# Patient Record
Sex: Female | Born: 1940 | Marital: Single | Smoking: Former smoker
Health system: Southern US, Community
[De-identification: ages and names within clinical notes are randomized; demographics above are authoritative.]

## PROBLEM LIST (undated history)

## (undated) DIAGNOSIS — I10 Essential (primary) hypertension: Secondary | ICD-10-CM

## (undated) DIAGNOSIS — K219 Gastro-esophageal reflux disease without esophagitis: Secondary | ICD-10-CM

## (undated) DIAGNOSIS — H3530 Unspecified macular degeneration: Secondary | ICD-10-CM

---

## 2009-05-14 DEATH — deceased

## 2013-07-28 ENCOUNTER — Inpatient Hospital Stay: Payer: Self-pay | Admitting: Internal Medicine

## 2013-07-28 LAB — URINALYSIS, COMPLETE
Bacteria: NONE SEEN
Bilirubin,UR: NEGATIVE
Glucose,UR: 50 mg/dL (ref 0–75)
Ph: 6 (ref 4.5–8.0)
Protein: NEGATIVE
RBC,UR: <1 /HPF (ref 0–5)
Specific Gravity: 1.011 (ref 1.003–1.030)
WBC UR: 2 /HPF (ref 0–5)

## 2013-07-28 LAB — COMPREHENSIVE METABOLIC PANEL
Albumin: 3.6 g/dL (ref 3.4–5.0)
Alkaline Phosphatase: 82 U/L
Anion Gap: 7 (ref 7–16)
BUN: 26 mg/dL — ABNORMAL HIGH (ref 7–18)
Calcium, Total: 9.7 mg/dL (ref 8.5–10.1)
Co2: 23 mmol/L (ref 21–32)
Creatinine: 1.14 mg/dL (ref 0.60–1.30)
EGFR (African American): 56 — ABNORMAL LOW
EGFR (Non-African Amer.): 49 — ABNORMAL LOW
Glucose: 108 mg/dL — ABNORMAL HIGH (ref 65–99)
Osmolality: 274 (ref 275–301)
SGOT(AST): 19 U/L (ref 15–37)
SGPT (ALT): 20 U/L (ref 12–78)
Total Protein: 7 g/dL (ref 6.4–8.2)

## 2013-07-28 LAB — CBC
HCT: 35.2 % (ref 35.0–47.0)
HGB: 12 g/dL (ref 12.0–16.0)
Platelet: 214 10*3/uL (ref 150–440)
RBC: 4.2 10*6/uL (ref 3.80–5.20)
RDW: 13.8 % (ref 11.5–14.5)
WBC: 6.4 10*3/uL (ref 3.6–11.0)

## 2013-07-28 LAB — TROPONIN I: Troponin-I: 0.1 ng/mL — ABNORMAL HIGH

## 2013-07-29 DIAGNOSIS — I059 Rheumatic mitral valve disease, unspecified: Secondary | ICD-10-CM

## 2013-07-29 LAB — CK: CK, Total: 65 U/L (ref 21–215)

## 2013-07-29 LAB — COMPREHENSIVE METABOLIC PANEL
Alkaline Phosphatase: 71 U/L
BUN: 27 mg/dL — ABNORMAL HIGH (ref 7–18)
Calcium, Total: 8.8 mg/dL (ref 8.5–10.1)
Chloride: 104 mmol/L (ref 98–107)
Co2: 26 mmol/L (ref 21–32)
Creatinine: 1.62 mg/dL — ABNORMAL HIGH (ref 0.60–1.30)
EGFR (African American): 37 — ABNORMAL LOW
EGFR (Non-African Amer.): 32 — ABNORMAL LOW
Glucose: 111 mg/dL — ABNORMAL HIGH (ref 65–99)
Osmolality: 276 (ref 275–301)
SGOT(AST): 20 U/L (ref 15–37)
Sodium: 135 mmol/L — ABNORMAL LOW (ref 136–145)
Total Protein: 6.3 g/dL — ABNORMAL LOW (ref 6.4–8.2)

## 2013-07-29 LAB — TROPONIN I: Troponin-I: 0.11 ng/mL — ABNORMAL HIGH

## 2013-07-29 LAB — CBC WITH DIFFERENTIAL/PLATELET
Basophil #: 0.1 10*3/uL (ref 0.0–0.1)
Basophil %: 0.9 %
Eosinophil #: 0.1 10*3/uL (ref 0.0–0.7)
Eosinophil %: 1.4 %
HCT: 32.2 % — ABNORMAL LOW (ref 35.0–47.0)
HGB: 11.2 g/dL — ABNORMAL LOW (ref 12.0–16.0)
Lymphocyte #: 1.8 10*3/uL (ref 1.0–3.6)
MCV: 84 fL (ref 80–100)
Monocyte %: 9.7 %
Neutrophil #: 3.2 10*3/uL (ref 1.4–6.5)
Neutrophil %: 56 %
RBC: 3.83 10*6/uL (ref 3.80–5.20)
RDW: 13.6 % (ref 11.5–14.5)

## 2013-07-29 LAB — LIPID PANEL
Cholesterol: 173 mg/dL (ref 0–200)
HDL Cholesterol: 56 mg/dL (ref 40–60)
Ldl Cholesterol, Calc: 92 mg/dL (ref 0–100)
Triglycerides: 125 mg/dL (ref 0–200)
VLDL Cholesterol, Calc: 25 mg/dL (ref 5–40)

## 2013-07-29 LAB — MAGNESIUM: Magnesium: 1.6 mg/dL — ABNORMAL LOW

## 2013-07-30 LAB — CREATININE, SERUM
Creatinine: 1.35 mg/dL — ABNORMAL HIGH (ref 0.60–1.30)
EGFR (African American): 46 — ABNORMAL LOW
EGFR (Non-African Amer.): 40 — ABNORMAL LOW

## 2013-07-30 LAB — TSH: Thyroid Stimulating Horm: 1.67 u[IU]/mL

## 2013-10-11 ENCOUNTER — Inpatient Hospital Stay: Admit: 2013-10-11 | Payer: Self-pay | Admitting: Neurology

## 2013-10-11 ENCOUNTER — Emergency Department: Payer: Self-pay | Admitting: Emergency Medicine

## 2013-10-11 ENCOUNTER — Encounter (HOSPITAL_COMMUNITY): Payer: Self-pay | Admitting: Emergency Medicine

## 2013-10-11 ENCOUNTER — Inpatient Hospital Stay (HOSPITAL_COMMUNITY)
Admission: EM | Admit: 2013-10-11 | Discharge: 2013-10-22 | DRG: 064 | Disposition: A | Discharge status: Hospice | Payer: Medicare Other | Attending: Internal Medicine | Admitting: Internal Medicine

## 2013-10-11 ENCOUNTER — Inpatient Hospital Stay (HOSPITAL_COMMUNITY): Payer: Medicare Other

## 2013-10-11 ENCOUNTER — Emergency Department (HOSPITAL_COMMUNITY): Payer: Medicare Other

## 2013-10-11 DIAGNOSIS — H3530 Unspecified macular degeneration: Secondary | ICD-10-CM | POA: Diagnosis present

## 2013-10-11 DIAGNOSIS — Z87891 Personal history of nicotine dependence: Secondary | ICD-10-CM

## 2013-10-11 DIAGNOSIS — J9600 Acute respiratory failure, unspecified whether with hypoxia or hypercapnia: Secondary | ICD-10-CM | POA: Diagnosis not present

## 2013-10-11 DIAGNOSIS — Z515 Encounter for palliative care: Secondary | ICD-10-CM

## 2013-10-11 DIAGNOSIS — D649 Anemia, unspecified: Secondary | ICD-10-CM

## 2013-10-11 DIAGNOSIS — R7309 Other abnormal glucose: Secondary | ICD-10-CM | POA: Diagnosis present

## 2013-10-11 DIAGNOSIS — I6340 Cerebral infarction due to embolism of unspecified cerebral artery: Principal | ICD-10-CM | POA: Diagnosis present

## 2013-10-11 DIAGNOSIS — I5031 Acute diastolic (congestive) heart failure: Secondary | ICD-10-CM

## 2013-10-11 DIAGNOSIS — I214 Non-ST elevation (NSTEMI) myocardial infarction: Secondary | ICD-10-CM

## 2013-10-11 DIAGNOSIS — R633 Feeding difficulties: Secondary | ICD-10-CM

## 2013-10-11 DIAGNOSIS — G8190 Hemiplegia, unspecified affecting unspecified side: Secondary | ICD-10-CM | POA: Diagnosis present

## 2013-10-11 DIAGNOSIS — R1310 Dysphagia, unspecified: Secondary | ICD-10-CM

## 2013-10-11 DIAGNOSIS — I6350 Cerebral infarction due to unspecified occlusion or stenosis of unspecified cerebral artery: Secondary | ICD-10-CM

## 2013-10-11 DIAGNOSIS — Z8249 Family history of ischemic heart disease and other diseases of the circulatory system: Secondary | ICD-10-CM

## 2013-10-11 DIAGNOSIS — Z66 Do not resuscitate: Secondary | ICD-10-CM | POA: Diagnosis present

## 2013-10-11 DIAGNOSIS — E870 Hyperosmolality and hypernatremia: Secondary | ICD-10-CM

## 2013-10-11 DIAGNOSIS — I69391 Dysphagia following cerebral infarction: Secondary | ICD-10-CM

## 2013-10-11 DIAGNOSIS — N179 Acute kidney failure, unspecified: Secondary | ICD-10-CM | POA: Diagnosis present

## 2013-10-11 DIAGNOSIS — I658 Occlusion and stenosis of other precerebral arteries: Secondary | ICD-10-CM | POA: Diagnosis present

## 2013-10-11 DIAGNOSIS — I6529 Occlusion and stenosis of unspecified carotid artery: Secondary | ICD-10-CM | POA: Diagnosis present

## 2013-10-11 DIAGNOSIS — R1312 Dysphagia, oropharyngeal phase: Secondary | ICD-10-CM | POA: Diagnosis present

## 2013-10-11 DIAGNOSIS — I4891 Unspecified atrial fibrillation: Secondary | ICD-10-CM

## 2013-10-11 DIAGNOSIS — E785 Hyperlipidemia, unspecified: Secondary | ICD-10-CM | POA: Diagnosis present

## 2013-10-11 DIAGNOSIS — K219 Gastro-esophageal reflux disease without esophagitis: Secondary | ICD-10-CM | POA: Diagnosis present

## 2013-10-11 DIAGNOSIS — I509 Heart failure, unspecified: Secondary | ICD-10-CM | POA: Diagnosis not present

## 2013-10-11 DIAGNOSIS — I639 Cerebral infarction, unspecified: Secondary | ICD-10-CM | POA: Insufficient documentation

## 2013-10-11 DIAGNOSIS — R414 Neurologic neglect syndrome: Secondary | ICD-10-CM | POA: Diagnosis present

## 2013-10-11 DIAGNOSIS — N289 Disorder of kidney and ureter, unspecified: Secondary | ICD-10-CM | POA: Diagnosis not present

## 2013-10-11 DIAGNOSIS — R29810 Facial weakness: Secondary | ICD-10-CM | POA: Diagnosis present

## 2013-10-11 DIAGNOSIS — R6339 Other feeding difficulties: Secondary | ICD-10-CM

## 2013-10-11 DIAGNOSIS — I10 Essential (primary) hypertension: Secondary | ICD-10-CM | POA: Diagnosis present

## 2013-10-11 DIAGNOSIS — E876 Hypokalemia: Secondary | ICD-10-CM | POA: Diagnosis present

## 2013-10-11 DIAGNOSIS — R0600 Dyspnea, unspecified: Secondary | ICD-10-CM

## 2013-10-11 DIAGNOSIS — Z79899 Other long term (current) drug therapy: Secondary | ICD-10-CM

## 2013-10-11 HISTORY — DX: Gastro-esophageal reflux disease without esophagitis: K21.9

## 2013-10-11 HISTORY — DX: Unspecified macular degeneration: H35.30

## 2013-10-11 HISTORY — DX: Essential (primary) hypertension: I10

## 2013-10-11 LAB — COMPREHENSIVE METABOLIC PANEL
ALBUMIN: 3.6 g/dL (ref 3.4–5.0)
ALBUMIN: 3.2 g/dL — AB (ref 3.5–5.2)
ALT: 20 U/L (ref 0–35)
AST: 91 U/L — AB (ref 15–37)
AST: 53 U/L — AB (ref 0–37)
Alkaline Phosphatase: 77 U/L
Alkaline Phosphatase: 68 U/L (ref 39–117)
Anion Gap: 8 (ref 7–16)
BUN: 26 mg/dL — AB (ref 7–18)
BUN: 28 mg/dL — ABNORMAL HIGH (ref 6–23)
Bilirubin,Total: 0.7 mg/dL (ref 0.2–1.0)
CALCIUM: 9.4 mg/dL (ref 8.4–10.5)
CHLORIDE: 103 mmol/L (ref 98–107)
CO2: 25 mEq/L (ref 19–32)
CREATININE: 1.12 mg/dL (ref 0.60–1.30)
Calcium, Total: 9.5 mg/dL (ref 8.5–10.1)
Chloride: 98 mEq/L (ref 96–112)
Co2: 31 mmol/L (ref 21–32)
Creatinine, Ser: 1.19 mg/dL — ABNORMAL HIGH (ref 0.50–1.10)
EGFR (African American): 58 — ABNORMAL LOW
GFR CALC NON AF AMER: 50 — AB
GFR calc Af Amer: 52 mL/min — ABNORMAL LOW (ref >90)
GFR calc non Af Amer: 45 mL/min — ABNORMAL LOW (ref >90)
Glucose, Bld: 155 mg/dL — ABNORMAL HIGH (ref 70–99)
Glucose: 114 mg/dL — ABNORMAL HIGH (ref 65–99)
OSMOLALITY: 289 (ref 275–301)
Potassium: 3.5 mmol/L (ref 3.5–5.1)
Potassium: 3.2 mEq/L — ABNORMAL LOW (ref 3.7–5.3)
SGPT (ALT): 30 U/L (ref 12–78)
Sodium: 142 mmol/L (ref 136–145)
Sodium: 148 mEq/L — ABNORMAL HIGH (ref 137–147)
Total Bilirubin: 0.4 mg/dL (ref 0.3–1.2)
Total Protein: 7 g/dL (ref 6.4–8.2)
Total Protein: 6.1 g/dL (ref 6.0–8.3)

## 2013-10-11 LAB — URINALYSIS, COMPLETE
Bacteria: NONE SEEN
Bilirubin,UR: NEGATIVE
Blood: NEGATIVE
Glucose,UR: 150 mg/dL (ref 0–75)
Hyaline Cast: 7
Leukocyte Esterase: NEGATIVE
NITRITE: NEGATIVE
Ph: 6 (ref 4.5–8.0)
Protein: 30
RBC,UR: 3 /HPF (ref 0–5)
Specific Gravity: 1.016 (ref 1.003–1.030)
Squamous Epithelial: NONE SEEN
WBC UR: 1 /HPF (ref 0–5)

## 2013-10-11 LAB — CBC WITH DIFFERENTIAL/PLATELET
BASOS ABS: 0 10*3/uL (ref 0.0–0.1)
Basophils Relative: 0 % (ref 0–1)
Eosinophils Absolute: 0 10*3/uL (ref 0.0–0.7)
Eosinophils Relative: 0 % (ref 0–5)
HCT: 28.1 % — ABNORMAL LOW (ref 36.0–46.0)
Hemoglobin: 9.6 g/dL — ABNORMAL LOW (ref 12.0–15.0)
Lymphocytes Relative: 11 % — ABNORMAL LOW (ref 12–46)
Lymphs Abs: 1 10*3/uL (ref 0.7–4.0)
MCH: 29.8 pg (ref 26.0–34.0)
MCHC: 34.2 g/dL (ref 30.0–36.0)
MCV: 87.3 fL (ref 78.0–100.0)
Monocytes Absolute: 0.8 10*3/uL (ref 0.1–1.0)
Monocytes Relative: 9 % (ref 3–12)
NEUTROS ABS: 7.1 10*3/uL (ref 1.7–7.7)
Neutrophils Relative %: 80 % — ABNORMAL HIGH (ref 43–77)
Platelets: 198 10*3/uL (ref 150–400)
RBC: 3.22 MIL/uL — ABNORMAL LOW (ref 3.87–5.11)
RDW: 15.4 % (ref 11.5–15.5)
WBC: 8.9 10*3/uL (ref 4.0–10.5)

## 2013-10-11 LAB — PROTIME-INR
INR: 1.09 (ref 0.00–1.49)
Prothrombin Time: 13.9 seconds (ref 11.6–15.2)

## 2013-10-11 LAB — TROPONIN I
TROPONIN-I: 6.9 ng/mL — AB
Troponin I: 4.15 ng/mL (ref <0.30)
Troponin I: 4.85 ng/mL (ref <0.30)

## 2013-10-11 LAB — CBC
HCT: 31.4 % — ABNORMAL LOW (ref 35.0–47.0)
HGB: 10.2 g/dL — ABNORMAL LOW (ref 12.0–16.0)
MCH: 28.4 pg (ref 26.0–34.0)
MCHC: 32.5 g/dL (ref 32.0–36.0)
MCV: 87 fL (ref 80–100)
Platelet: 217 10*3/uL (ref 150–440)
RBC: 3.59 10*6/uL — ABNORMAL LOW (ref 3.80–5.20)
RDW: 15.8 % — AB (ref 11.5–14.5)
WBC: 9.9 10*3/uL (ref 3.6–11.0)

## 2013-10-11 LAB — CK TOTAL AND CKMB (NOT AT ARMC)
CK, MB: 18.8 ng/mL (ref 0.3–4.0)
CK, Total: 868 U/L — ABNORMAL HIGH
CK-MB: 26.1 ng/mL — AB (ref 0.5–3.6)
RELATIVE INDEX: 2.6 — AB (ref 0.0–2.5)
Total CK: 710 U/L — ABNORMAL HIGH (ref 7–177)

## 2013-10-11 LAB — APTT
Activated PTT: 32.1 secs (ref 23.6–35.9)
aPTT: 35 seconds (ref 24–37)

## 2013-10-11 LAB — GLUCOSE, CAPILLARY: Glucose-Capillary: 138 mg/dL — ABNORMAL HIGH (ref 70–99)

## 2013-10-11 LAB — CK-MB: CK-MB: 25.6 ng/mL — ABNORMAL HIGH (ref 0.5–3.6)

## 2013-10-11 LAB — MRSA PCR SCREENING: MRSA by PCR: NEGATIVE

## 2013-10-11 MED ORDER — KCL IN DEXTROSE-NACL 40-5-0.9 MEQ/L-%-% IV SOLN
INTRAVENOUS | Status: DC
  Administered 2013-10-11 – 2013-10-13 (×3): via INTRAVENOUS
  Filled 2013-10-11 (×5): qty 1000

## 2013-10-11 MED ORDER — AMIODARONE HCL IN DEXTROSE 360-4.14 MG/200ML-% IV SOLN
30.0000 mg/h | INTRAVENOUS | Status: DC
Start: 2013-10-12 — End: 2013-10-12
  Administered 2013-10-12: 30 mg/h via INTRAVENOUS
  Filled 2013-10-11 (×2): qty 200

## 2013-10-11 MED ORDER — SENNOSIDES-DOCUSATE SODIUM 8.6-50 MG PO TABS
1.0000 | ORAL_TABLET | Freq: Every evening | ORAL | Status: DC | PRN
  Filled 2013-10-11: qty 1

## 2013-10-11 MED ORDER — FAMOTIDINE IN NACL 20-0.9 MG/50ML-% IV SOLN
20.0000 mg | Freq: Two times a day (BID) | INTRAVENOUS | Status: DC
  Administered 2013-10-11 – 2013-10-12 (×2): 20 mg via INTRAVENOUS
  Filled 2013-10-11 (×3): qty 50

## 2013-10-11 MED ORDER — HEPARIN SODIUM (PORCINE) 5000 UNIT/ML IJ SOLN
5000.0000 [IU] | Freq: Three times a day (TID) | INTRAMUSCULAR | Status: DC
  Administered 2013-10-11 – 2013-10-20 (×27): 5000 [IU] via SUBCUTANEOUS
  Filled 2013-10-11 (×30): qty 1

## 2013-10-11 MED ORDER — AMIODARONE HCL IN DEXTROSE 360-4.14 MG/200ML-% IV SOLN
60.0000 mg/h | INTRAVENOUS | Status: AC
  Administered 2013-10-11: 60 mg/h via INTRAVENOUS
  Filled 2013-10-11: qty 200

## 2013-10-11 MED ORDER — INSULIN ASPART 100 UNIT/ML ~~LOC~~ SOLN
1.0000 [IU] | SUBCUTANEOUS | Status: DC
Start: 2013-10-12 — End: 2013-10-13
  Administered 2013-10-12 (×2): 1 [IU] via SUBCUTANEOUS

## 2013-10-11 MED ORDER — ASPIRIN 300 MG RE SUPP
300.0000 mg | Freq: Every day | RECTAL | Status: DC
  Administered 2013-10-12 – 2013-10-17 (×7): 300 mg via RECTAL
  Filled 2013-10-11 (×9): qty 1

## 2013-10-11 MED ORDER — IOHEXOL 350 MG/ML SOLN
50.0000 mL | Freq: Once | INTRAVENOUS | Status: AC | PRN
  Administered 2013-10-11: 50 mL via INTRAVENOUS

## 2013-10-11 MED ORDER — POTASSIUM CHLORIDE 10 MEQ/100ML IV SOLN
10.0000 meq | INTRAVENOUS | Status: AC
  Administered 2013-10-11 (×2): 10 meq via INTRAVENOUS
  Filled 2013-10-11 (×3): qty 100

## 2013-10-11 MED ORDER — AMIODARONE HCL IN DEXTROSE 360-4.14 MG/200ML-% IV SOLN
60.0000 mg/h | Freq: Once | INTRAVENOUS | Status: AC
  Administered 2013-10-11: 60 mg/h via INTRAVENOUS

## 2013-10-11 NOTE — ED Notes (Signed)
Neuro assessment continued at the bedside at arrival from CT.

## 2013-10-11 NOTE — ED Notes (Signed)
Patient returned to the room from CT.

## 2013-10-11 NOTE — ED Notes (Signed)
After reading the medica records it was noted that previous staff stated the pt had some trouble forming words at 1331.  When asked the family at bedside stated that this is not the pt's baseline, and was slightly abnormal for her.  Per neurology pt's last seen normal is now considered to be last night and not this afternoon.

## 2013-10-11 NOTE — Consult Note (Signed)
Referring Physician: Lynelle Doctor    Chief Complaint: Right sided weakness  HPI: Wendy Malone is an 73 y.o. female who went to bed normal last evening.  Was found on the floor by her daughter this morning.  Patient is unclear about the details of what happened.  Patient was initially taken to [facility] where the patient reported that she thought she got dizzy and fell the day before.  Was initially evaluated as being able to move all extremities but speech was altered and patient was having trouble getting her words out.  While being further evaluated in the ED the patient was noted to have right sided weakness.  SOC was contacted and it was felt that the patient would benefit from the additional services available to her at a Stroke Center.  Patient was transferred to [facility] at that time.  Initial NIHSS on presentation to [facility] of 18.   While being evaluated at [facility] patient also noted to have an elevated troponin.  She was given a bolus of Heparin.  Also noted to be in atrial fibrillation.  Date last known well: Date: 10/10/2013 Time last known well: Time: 20:00 tPA Given: No: Patient outside time window  Past Medical History  Diagnosis Date  . Hypertension     since December 2014    History reviewed. No pertinent past surgical history.  Family history: Daughter alive and well.    Social History:  reports that she quit smoking about 2 months ago. She does not have any smokeless tobacco history on file. She reports that she does not drink alcohol or use illicit drugs.  Allergies: No Known Allergies  Medications: I have reviewed the patient's current medications. Prior to Admission:  Current outpatient prescriptions: aspirin 81 MG tablet, Take 81 mg by mouth daily., Disp: , Rfl: ;   metoprolol tartrate (LOPRESSOR) 25 MG tablet, Take 25 mg by mouth 2 (two) times daily., Disp: , Rfl: ;   ranitidine (ZANTAC) 150 MG tablet, Take 150 mg by mouth daily., Disp: , Rfl:   ROS: History obtained  from the patient  General ROS: negative for - chills, fatigue, fever, night sweats, weight gain or weight loss Psychological ROS: negative for - behavioral disorder, hallucinations, memory difficulties, mood swings or suicidal ideation Ophthalmic ROS: negative for - blurry vision, double vision, eye pain or loss of vision ENT ROS: negative for - epistaxis, nasal discharge, oral lesions, sore throat, tinnitus or vertigo Allergy and Immunology ROS: negative for - hives or itchy/watery eyes Hematological and Lymphatic ROS: negative for - bleeding problems, bruising or swollen lymph nodes Endocrine ROS: negative for - galactorrhea, hair pattern changes, polydipsia/polyuria or temperature intolerance Respiratory ROS: negative for - cough, hemoptysis, shortness of breath or wheezing Cardiovascular ROS: negative for - chest pain, dyspnea on exertion, edema or irregular heartbeat Gastrointestinal ROS: negative for - abdominal pain, diarrhea, hematemesis, nausea/vomiting or stool incontinence Genito-Urinary ROS: negative for - dysuria, hematuria, incontinence or urinary frequency/urgency Musculoskeletal ROS: negative for - joint swelling or muscular weakness Neurological ROS: as noted in HPI Dermatological ROS: erythematous, escoriated right knee  Physical Examination: Blood pressure 182/60, pulse 76, temperature 98.4 F (36.9 C), temperature source Oral, resp. rate 14, SpO2 100.00%.  Neurologic Examination: Mental Status: Alert, oriented, thought content appropriate.  Speech fluent without evidence of aphasia but slurred.  Able to follow simple commands without difficulty. Cranial Nerves: II: Discs flat bilaterally; Visual fields grossly normal, pupils equal, round, reactive to light and accommodation III,IV, VI: ptosis not present, patient  unable to go beyond midline to the right V,VII: right facial droop, facial light touch sensation decreased on the right VIII: hearing normal  bilaterally IX,X: gag reflex reduced XI: shoulder shrug decreased on the right XII: midline tongue extension Motor: Right : Upper extremity   1/5    Left:     Upper extremity   5/5  Lower extremity   1/5     Lower extremity   5/5 Tone and bulk:normal tone throughout; no atrophy noted Sensory: Pinprick and light touch decreased on the right Deep Tendon Reflexes: 2+ throughout with an absent left ankle jerk Plantars: Right: upgoing   Left: mute Cerebellar: normal finger-to-nose and normal heel-to-shin testing on the left.  Unable to perform on the right Gait: Unable to test due to weakness CV: pulses palpable throughout     Laboratory Studies: (Lab results from [place] reviewed) Basic Metabolic Panel:  Recent Labs Lab 10/11/13 1857  NA 148*  K 3.2*  CL 98  CO2 25  GLUCOSE 155*  BUN 28*  CREATININE 1.19*  CALCIUM 9.4    Liver Function Tests:  Recent Labs Lab 10/11/13 1857  AST 53*  ALT 20  ALKPHOS 68  BILITOT 0.4  PROT 6.1  ALBUMIN 3.2*   No results found for this basename: LIPASE, AMYLASE,  in the last 168 hours No results found for this basename: AMMONIA,  in the last 168 hours  CBC:  Recent Labs Lab 10/11/13 1857  WBC 8.9  NEUTROABS 7.1  HGB 9.6*  HCT 28.1*  MCV 87.3  PLT 198    Cardiac Enzymes:  Recent Labs Lab 10/11/13 1857  CKTOTAL 710*  CKMB 18.8*  TROPONINI 4.15*    BNP: No components found with this basename: POCBNP,   CBG: No results found for this basename: GLUCAP,  in the last 168 hours  Microbiology: No results found for this or any previous visit.  Coagulation Studies:  Recent Labs  10/11/13 1857  LABPROT 13.9  INR 1.09    Urinalysis: No results found for this basename: COLORURINE, APPERANCEUR, LABSPEC, PHURINE, GLUCOSEU, HGBUR, BILIRUBINUR, KETONESUR, PROTEINUR, UROBILINOGEN, NITRITE, LEUKOCYTESUR,  in the last 168 hours  Lipid Panel: No results found for this basename: chol,  trig,  hdl,  cholhdl,  vldl,   ldlcalc    HgbA1C:  No results found for this basename: HGBA1C    Urine Drug Screen:   No results found for this basename: labopia,  cocainscrnur,  labbenz,  amphetmu,  thcu,  labbarb    Alcohol Level: No results found for this basename: ETH,  in the last 168 hours  Other results: EKG: atrial fibrillation at 82 bpm.  Imaging: CT head: No acute changes.  Extensive small vessel ischemic changes.    Assessment: 73 y.o. female presenting with slurred speech, left eye preference and right hemiparesis.  Left brain stroke suspected.  Patient with elevated troponins as well.  Head CT reviewed and shows no acute changes.  Patient outside treatment window for both intervention and tPA.  Further work up recommended.  Will likely benefit from the input of other services as well.    Stroke Risk Factors - atrial fibrillation and hypertension  Plan: 1. HgbA1c, fasting lipid panel 2. MRI of the brain without contrast 3. PT consult, OT consult, Speech consult 4. Echocardiogram 5. Carotid dopplers 6. Prophylactic therapy-Antiplatelet med: Aspirin - dose 300mg  rectally.  Would refrain from anticoagulation if at all possible, due to suspected size of stroke and risk of hemorrhage.   7.  Risk factor modification 8. Telemetry monitoring 9. Frequent neuro checks 10.  CCM consulted about admission.  Patient may also require cardiology and wound care input as well.   11.  CTA of the brain  This patient is critically ill and at significant risk of neurological worsening, death and care requires constant monitoring of vital signs, hemodynamics,respiratory and cardiac monitoring, neurological assessment, discussion with family, other specialists and medical decision making of high complexity. I spent 90 minutes of neurocritical care time in the care of  this patient.  Thana FarrLeslie Payeton Germani, MD [facility] [phone] 10/11/2013  8:24 PM

## 2013-10-11 NOTE — ED Notes (Signed)
MD at bedside. (Dr. Thad Rangereynolds at the bedside).

## 2013-10-11 NOTE — ED Notes (Signed)
Patient has macular degenerative disease.

## 2013-10-11 NOTE — H&P (Signed)
Name: Wendy Malone MRN: [id number] DOB: 12-03-1940    ADMISSION DATE:  10/11/2013  PRIMARY SERVICE: PCCM  CHIEF COMPLAINT:  Ischemic stroke  BRIEF PATIENT DESCRIPTION:  73 years old female with PMH relevant for HTN and GERD. Presents with right hemiparesis and facial droop. Last seen normal the day prior. Out of window for TPA. Awake, alert, oriented and protecting her airway. Hemodynamically stable. Intermittent rapid A. Fib on amiodarone drip. DNR/DNI.  SIGNIFICANT EVENTS / STUDIES:  IMPRESSION:  1. No acute or subacute cortical infarct is evident.  2. Bilateral basal ganglia infarcts are age indeterminate.  3. 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  4. Segmental stenoses within the PCA branch vessels bilaterally and  right greater than left MCA branch vessels.  5. Segmental stenoses within the pericallosal arteries, right  greater than left.   LINES / TUBES: - Peripheral IV's  CULTURES:   ANTIBIOTICS:   HISTORY OF PRESENT ILLNESS:   73 years old female with PMH relevant for HTN and GERD. Was found on the floor this morning by her daughter. The patient was last seen doing well the night prior and as per the patient she fell that night. She was transferred to [facility] where she was found to be on A. Fib and with right hemiparesis, slurred speech and right facial droop. She was transferred to [facility] for management. Seen by Neurology (Dr Thad Ranger) in the ED. CT of the head with no evidence of hemorrhage on no acute or subacute infarct seen. Patient out of window for TPA. Started on amiodarone drip for intermittent A. Fib with RVR. EKG shows sinus rhythm with <109mm ST depression from V3 to V5. Troponin of 4 here at [facility], apparently at [facility] was 6. No chest pain. At the time of my exam the patient is awake, aler, oriented x 3 and hemodynamically stable. On sinus rhythm. Protecting her airway.   PAST MEDICAL HISTORY :  Past Medical History    Diagnosis Date  . Hypertension     since December 2014   History reviewed. No pertinent past surgical history. Prior to Admission medications   Medication Sig Start Date End Date Taking? Authorizing Provider  aspirin 81 MG tablet Take 81 mg by mouth daily.   Yes Historical Provider, MD  metoprolol tartrate (LOPRESSOR) 25 MG tablet Take 25 mg by mouth 2 (two) times daily. 09/23/13  Yes Historical Provider, MD  ranitidine (ZANTAC) 150 MG tablet Take 150 mg by mouth daily.   Yes Historical Provider, MD   No Known Allergies  FAMILY HISTORY:  History reviewed. No pertinent family history. SOCIAL HISTORY:  reports that she quit smoking about 2 months ago. She does not have any smokeless tobacco history on file. She reports that she does not drink alcohol or use illicit drugs.  REVIEW OF SYSTEMS:  All systems reviewed and found negative except for what I mentioned in the HPI.   SUBJECTIVE:   VITAL SIGNS: Temp:  [98.4 F (36.9 C)] 98.4 F (36.9 C) (02/28 2015) Pulse Rate:  [74-85] 76 (02/28 2010) Resp:  [13-14] 13 (02/28 2100) BP: (166-188)/(49-76) 170/76 mmHg (02/28 2100) SpO2:  [100 %] 100 % (02/28 2010) HEMODYNAMICS:   VENTILATOR SETTINGS:   INTAKE / OUTPUT: Intake/Output   None     PHYSICAL EXAMINATION: General: Pleasant female patient in no acute distress. Eyes: Anicteric sclerae. ENT: Oropharynx clear. Dry mucous membranes. No thrush Lymph: No cervical, supraclavicular, or axillary lymphadenopathy. Heart: Normal S1, S2.  No murmurs, rubs, or gallops appreciated. No bruits, equal pulses. Lungs: Normal excursion, no dullness to percussion. Good air movement bilaterally, without wheezes or crackles. Normal upper airway sounds without evidence of stridor. Abdomen: Abdomen soft, non-tender and not distended, normoactive bowel sounds. No hepatosplenomegaly or masses. Musculoskeletal: No clubbing or synovitis. Skin: No rashes or lesions Neuro: Right hemiparesis 1/5 on both left  upper and lower extremity. Upgoing Babinski on the right and downgoing on the left. Right facial droop.   LABS:  CBC  Recent Labs Lab 10/11/13 1857  WBC 8.9  HGB 9.6*  HCT 28.1*  PLT 198   Coag's  Recent Labs Lab 10/11/13 1857  APTT 35  INR 1.09   BMET  Recent Labs Lab 10/11/13 1857  NA 148*  K 3.2*  CL 98  CO2 25  BUN 28*  CREATININE 1.19*  GLUCOSE 155*   Electrolytes  Recent Labs Lab 10/11/13 1857  CALCIUM 9.4   Sepsis Markers No results found for this basename: LATICACIDVEN, PROCALCITON, O2SATVEN,  in the last 168 hours ABG No results found for this basename: PHART, PCO2ART, PO2ART,  in the last 168 hours Liver Enzymes  Recent Labs Lab 10/11/13 1857  AST 53*  ALT 20  ALKPHOS 68  BILITOT 0.4  ALBUMIN 3.2*   Cardiac Enzymes  Recent Labs Lab 10/11/13 1857  TROPONINI 4.15*   Glucose No results found for this basename: GLUCAP,  in the last 168 hours  Imaging Dg Chest 2 View  10/11/2013   CLINICAL DATA:  Stroke.  EXAM: CHEST  2 VIEW  COMPARISON:  None.  FINDINGS: Cardiac silhouette is mildly enlarged. Normal mediastinal and hilar contours.  There are prominent bronchovascular markings. Lungs are otherwise clear. No pleural effusion or pneumothorax.  The bony thorax is intact.  IMPRESSION: No acute cardiopulmonary disease.   Electronically Signed   By: Amie Portland M.D.   On: 10/11/2013 21:03     CXR:  Normal chest X ray  ASSESSMENT / PLAN:  PULMONARY A: 1) No issues. Protecting her airway. P:    CARDIOVASCULAR A:  1) Intermittent rapid A. Fib 2) Elevated troponin with non specific EKG changes. No chest pain P:  - Continue amiodarone drip - Will trend troponin - As per neurology, we will try to avoid anticoagulation given the size of the stroke and risk for bleeding. If troponin trending up and absolutely necessary from the Cardiology standpoint we would do careful Heparin drip  with no bolus.  RENAL A:   1) Mild acute renal  failure, mildly elevated CK 2) Hypokalemia 3) Hypernatremia P:   - IVF with D5//NS with 40 meq of KCL at 100cc/hr - Will give also 2 rounds of IV KCL 10 meq - Will follow BMP in am  GASTROINTESTINAL A:   1) GERD P:   - NPO - Pepcid 20 mg IV q12h  HEMATOLOGIC A:   1) Anemia, unclear etiology. Will need workup P:  - Will follow CBC  INFECTIOUS A:   1) No issues P:    ENDOCRINE A:   1) No history of DM, Hyperglycemia P:   - Novolog sliding scale - Hgb A1c  NEUROLOGIC A:   1) Acute ischemic left brain stroke. Out of window for TPA P:   - Neurology help appreciated. - MRI of the brain wo contrast - Carotid doppler - Echocardiogram - PT/OT - Speech / swallow evaluation - Frequent neuro checks  - Aspirin 300 mg per rectum   TODAY'S SUMMARY:  I have personally obtained a history, examined the patient, evaluated laboratory and imaging results, formulated the assessment and plan and placed orders. CRITICAL CARE: The patient is critically ill with multiple organ systems failure and requires high complexity decision making for assessment and support, frequent evaluation and titration of therapies, application of advanced monitoring technologies and extensive interpretation of multiple databases. Critical Care Time devoted to patient care services described in this note is 60 minutes.   Overton MamAlan Siqueiros, MD Pulmonary and Critical Care Medicine [facility] Pager: [phone]  10/11/2013, 9:30 PM

## 2013-10-11 NOTE — ED Notes (Signed)
Pt to department via [place] as a Code Stroke from [facility]. Pt lives at home by herself. Daughter reports that she went to check on patient this morning and noted that she had fallen. Pt was taken to the ED and worked up for the fall and cardiac work up. Once at the ED pt was placed in a room and LSN was 1530. Pt was checked on at 1606 and was flaccid on the right side. Pt was noted to be in a-fib with [facility] and started in Aminodrone en route. Pt also received a 3000 units bolus of heparin. Pt arrived with 20g on the left forearm and 22g in the right wrist. Neuro met the patient at the bridge and taken straight to the CT scanner for a CT angio. Bp-110/50 Hr-89 on arrival.

## 2013-10-11 NOTE — Code Documentation (Signed)
Patient arrived via [facility] from [facility] at [phone].  Daughter found patient on bedroom floor about noon today.  Patient had fallen before bed last night.  The Daughter noted some difficulty with word finding and slurred speech when she found her at noon today.  Patient was last known well at 8pm last night.  While patient at [facility] her right side became flaccid about 4pm today.  Head CT and labs done at [facility].  Patient also in RAF, Amiodarone Bolus and gtt given per [facility] RN.  Amiodarone gtt infusing at 1mg /min upon arrival.  Patient HR NSR 80 with burst of RAF 140s.  CT angio done upon arrival.  Dr Thad Rangereynolds at bedside to assess patient.  Daughter at bedside. Plan admit to Hospital with Critical care service.

## 2013-10-11 NOTE — ED Notes (Signed)
Patient transported to X-ray 

## 2013-10-11 NOTE — ED Notes (Signed)
Introduced self to family in the room.  Patient is in CT at this time.

## 2013-10-11 NOTE — ED Notes (Signed)
MD at bedside. (Dr. Felipa FurnaceGarcia at the bedside)

## 2013-10-11 NOTE — ED Notes (Signed)
Patient returned to Xray.

## 2013-10-11 NOTE — ED Notes (Signed)
Daughter reports speech was altered at 1200 this afternoon, with a lot of pauses, and hesitation at [facility].

## 2013-10-12 ENCOUNTER — Ambulatory Visit: Payer: Self-pay | Admitting: Internal Medicine

## 2013-10-12 ENCOUNTER — Encounter (HOSPITAL_COMMUNITY): Payer: Self-pay | Admitting: Cardiology

## 2013-10-12 ENCOUNTER — Inpatient Hospital Stay (HOSPITAL_COMMUNITY): Payer: Medicare Other

## 2013-10-12 DIAGNOSIS — I517 Cardiomegaly: Secondary | ICD-10-CM

## 2013-10-12 DIAGNOSIS — I214 Non-ST elevation (NSTEMI) myocardial infarction: Secondary | ICD-10-CM

## 2013-10-12 DIAGNOSIS — I4891 Unspecified atrial fibrillation: Secondary | ICD-10-CM | POA: Diagnosis present

## 2013-10-12 DIAGNOSIS — I6350 Cerebral infarction due to unspecified occlusion or stenosis of unspecified cerebral artery: Secondary | ICD-10-CM

## 2013-10-12 LAB — LIPID PANEL
CHOL/HDL RATIO: 3 ratio
CHOLESTEROL: 177 mg/dL (ref 0–200)
HDL: 60 mg/dL (ref >39)
LDL Cholesterol: 93 mg/dL (ref 0–99)
Triglycerides: 121 mg/dL (ref <150)
VLDL: 24 mg/dL (ref 0–40)

## 2013-10-12 LAB — HEMOGLOBIN A1C
Hgb A1c MFr Bld: 5.6 % (ref <5.7)
Mean Plasma Glucose: 114 mg/dL (ref <117)

## 2013-10-12 LAB — GLUCOSE, CAPILLARY
GLUCOSE-CAPILLARY: 128 mg/dL — AB (ref 70–99)
GLUCOSE-CAPILLARY: 111 mg/dL — AB (ref 70–99)
Glucose-Capillary: 103 mg/dL — ABNORMAL HIGH (ref 70–99)
Glucose-Capillary: 121 mg/dL — ABNORMAL HIGH (ref 70–99)
Glucose-Capillary: 145 mg/dL — ABNORMAL HIGH (ref 70–99)

## 2013-10-12 LAB — BASIC METABOLIC PANEL
BUN: 38 mg/dL — AB (ref 6–23)
CALCIUM: 8.8 mg/dL (ref 8.4–10.5)
CO2: 29 mEq/L (ref 19–32)
CREATININE: 1.41 mg/dL — AB (ref 0.50–1.10)
Chloride: 108 mEq/L (ref 96–112)
GFR calc Af Amer: 43 mL/min — ABNORMAL LOW (ref >90)
GFR, EST NON AFRICAN AMERICAN: 37 mL/min — AB (ref >90)
GLUCOSE: 114 mg/dL — AB (ref 70–99)
Potassium: 4.2 mEq/L (ref 3.7–5.3)
Sodium: 149 mEq/L — ABNORMAL HIGH (ref 137–147)

## 2013-10-12 LAB — TROPONIN I
Troponin I: 4.45 ng/mL (ref <0.30)
Troponin I: 2.36 ng/mL (ref <0.30)

## 2013-10-12 LAB — RETICULOCYTES
RBC.: 2.76 MIL/uL — AB (ref 3.87–5.11)
RETIC CT PCT: 3.7 % — AB (ref 0.4–3.1)
Retic Count, Absolute: 102.1 10*3/uL (ref 19.0–186.0)

## 2013-10-12 MED ORDER — BIOTENE DRY MOUTH MT LIQD
15.0000 mL | Freq: Two times a day (BID) | OROMUCOSAL | Status: DC
  Administered 2013-10-12 – 2013-10-21 (×17): 15 mL via OROMUCOSAL

## 2013-10-12 MED ORDER — FAMOTIDINE IN NACL 20-0.9 MG/50ML-% IV SOLN
20.0000 mg | INTRAVENOUS | Status: DC
  Administered 2013-10-13 – 2013-10-15 (×3): 20 mg via INTRAVENOUS
  Filled 2013-10-12 (×3): qty 50

## 2013-10-12 MED ORDER — HYDRALAZINE HCL 20 MG/ML IJ SOLN
10.0000 mg | INTRAMUSCULAR | Status: DC | PRN
  Administered 2013-10-12 – 2013-10-14 (×5): 20 mg via INTRAVENOUS
  Filled 2013-10-12 (×5): qty 1

## 2013-10-12 MED ORDER — METOPROLOL TARTRATE 1 MG/ML IV SOLN
2.5000 mg | INTRAVENOUS | Status: DC | PRN
  Administered 2013-10-14: 5 mg via INTRAVENOUS
  Administered 2013-10-14: 2.5 mg via INTRAVENOUS
  Administered 2013-10-16: 5 mg via INTRAVENOUS

## 2013-10-12 MED ORDER — METOPROLOL TARTRATE 1 MG/ML IV SOLN
5.0000 mg | Freq: Four times a day (QID) | INTRAVENOUS | Status: DC
  Administered 2013-10-12 – 2013-10-16 (×17): 5 mg via INTRAVENOUS
  Filled 2013-10-12 (×24): qty 5

## 2013-10-12 NOTE — Progress Notes (Signed)
  Echocardiogram 2D Echocardiogram has been performed.  Georgian CoWILLIAMS, Maleki Hippe 10/12/2013, 12:17 PM

## 2013-10-12 NOTE — Progress Notes (Signed)
Name: Wendy Malone MRN: [id number] DOB: [birth year]    ADMISSION DATE:  10/11/2013  PRIMARY SERVICE: PCCM  CHIEF COMPLAINT:  Ischemic stroke  BRIEF PATIENT DESCRIPTION:  73 years old female with PMH relevant for HTN and GERD. Presents with right hemiparesis and facial droop. Last seen normal the day prior. Out of window for TPA. Awake, alert, oriented and protecting her airway. Hemodynamically stable. Intermittent rapid A. Fib on amiodarone drip. DNR/DNI.  SIGNIFICANT EVENTS / STUDIES:  IMPRESSION:  1. No acute or subacute cortical infarct is evident.  2. Bilateral basal ganglia infarcts are age indeterminate.  3. 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  4. Segmental stenoses within the PCA branch vessels bilaterally and right greater than left MCA branch vessels.  5. Segmental stenoses within the pericallosal arteries, right greater than left.   LINES / TUBES: - Peripheral IV's  CULTURES:   ANTIBIOTICS:  SUBJECTIVE: afebrile BP high this am Non verbal  VITAL SIGNS: Temp:  [98.3 F (36.8 C)-98.9 F (37.2 C)] 98.3 F (36.8 C) (03/01 0733) Pulse Rate:  [60-139] 71 (03/01 0900) Resp:  [13-23] 19 (03/01 0900) BP: (106-193)/(41-95) 193/61 mmHg (03/01 0900) SpO2:  [96 %-100 %] 100 % (03/01 0900) Weight:  [63.4 kg (139 lb 12.4 oz)] 63.4 kg (139 lb 12.4 oz) (02/28 2139) HEMODYNAMICS:   VENTILATOR SETTINGS:   INTAKE / OUTPUT: Intake/Output     02/28 0701 - 03/01 0700 03/01 0701 - 03/02 0700   I.V. (mL/kg) 1128.3 (17.8)    IV Piggyback 250    Total Intake(mL/kg) 1378.3 (21.7)    Net +1378.3          Urine Occurrence 5 x    Stool Occurrence 1 x      PHYSICAL EXAMINATION: General: Pleasant , in no acute distress. Eyes: Anicteric sclerae. ENT: Oropharynx clear. Dry mucous membranes. No thrush Lymph: No cervical, supraclavicular, or axillary lymphadenopathy. Heart: Normal S1, S2. No murmurs, rubs, or gallops appreciated. No bruits,  equal pulses. Lungs: Normal excursion, no dullness to percussion. Good air movement bilaterally, without wheezes or crackles. Normal upper airway sounds without evidence of stridor. Abdomen: Abdomen soft, non-tender and not distended, normoactive bowel sounds. No hepatosplenomegaly or masses. Musculoskeletal: No clubbing or synovitis. Skin: No rashes or lesions Neuro: Right hemiparesis 1/5 on right upper and lower extremity, left 4/5. Rt hemineglect Upgoing Babinski on the right and downgoing on the left. Right facial droop. Aphasic  LABS:  CBC  Recent Labs Lab 10/11/13 1857  WBC 8.9  HGB 9.6*  HCT 28.1*  PLT 198   Coag's  Recent Labs Lab 10/11/13 1857  APTT 35  INR 1.09   BMET  Recent Labs Lab 10/11/13 1857 10/12/13 0500  NA 148* 149*  K 3.2* 4.2  CL 98 108  CO2 25 29  BUN 28* 38*  CREATININE 1.19* 1.41*  GLUCOSE 155* 114*   Electrolytes  Recent Labs Lab 10/11/13 1857 10/12/13 0500  CALCIUM 9.4 8.8   Sepsis Markers No results found for this basename: LATICACIDVEN, PROCALCITON, O2SATVEN,  in the last 168 hours ABG No results found for this basename: PHART, PCO2ART, PO2ART,  in the last 168 hours Liver Enzymes  Recent Labs Lab 10/11/13 1857  AST 53*  ALT 20  ALKPHOS 68  BILITOT 0.4  ALBUMIN 3.2*   Cardiac Enzymes  Recent Labs Lab 10/11/13 1857 10/11/13 2206 10/12/13 0327  TROPONINI 4.15* 4.85* 4.45*   Glucose  Recent Labs Lab 10/11/13 2206 10/12/13 0027 10/12/13  0417  GLUCAP 138* 145* 128*    Imaging Ct Angio Head W/cm &/or Wo Cm  10/11/2013   CLINICAL DATA:  Code stroke. Right-sided weakness and slurred speech.  EXAM: CT ANGIOGRAPHY HEAD  TECHNIQUE: Multidetector CT imaging of the head was performed using the standard protocol during bolus administration of intravenous contrast. Multiplanar CT image reconstructions and MIPs were obtained to evaluate the vascular anatomy.  CONTRAST:  50mL OMNIPAQUE IOHEXOL 350 MG/ML SOLN  COMPARISON:   None.  FINDINGS: Gray-white differentiation is preserved the source images and postcontrast images without evidence for a focal cortical infarct. There are multiple lacunar infarcts within the basal ganglia bilaterally which are age-indeterminate. Extensive periventricular white matter hypoattenuation is present bilaterally.  No hemorrhage or mass lesion is evident.  The cervical internal carotid arteries are within normal limits bilaterally. Calcified and noncalcified plaque is present within the cavernous carotid arteries bilaterally. There is a moderate stenosis of the right cavernous carotid artery just proximal to the genu. Mild to moderate narrowing is present in the supra clinoid left internal carotid artery. Supra clinoid segments are normal bilaterally. The A1 and M1 segments are normal bilaterally. The anterior communicating artery is patent.  Segmental narrowing within the pericallosal arteries is worse on the right. There is segmental narrowing of distal MCA branch vessels bilaterally, right greater than left.  The left vertebral artery is dominant. The right PICA origin is visualized and normal. The left AICA is dominant. The basilar artery is within normal limits. Both posterior cerebral arteries originate from the basilar tip. Segmental stenoses are present in the proximal PCA branch vessels bilaterally. The dural sinuses are patent.  Review of the MIP images confirms the above findings.  IMPRESSION: 1. No acute or subacute cortical infarct is evident. 2. Bilateral basal ganglia infarcts are age indeterminate. 3. 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 4. Segmental stenoses within the PCA branch vessels bilaterally and right greater than left MCA branch vessels. 5. Segmental stenoses within the pericallosal arteries, right greater than left.   Electronically Signed   By: Gennette Pac M.D.   On: 10/11/2013 21:31   Dg Chest 2 View  10/11/2013   CLINICAL DATA:   Stroke.  EXAM: CHEST  2 VIEW  COMPARISON:  None.  FINDINGS: Cardiac silhouette is mildly enlarged. Normal mediastinal and hilar contours.  There are prominent bronchovascular markings. Lungs are otherwise clear. No pleural effusion or pneumothorax.  The bony thorax is intact.  IMPRESSION: No acute cardiopulmonary disease.   Electronically Signed   By: Amie Portland M.D.   On: 10/11/2013 21:03   Ct Head Wo Contrast  10/12/2013   CLINICAL DATA:  Altered mental status.  Unable to speak.  EXAM: CT HEAD WITHOUT CONTRAST  TECHNIQUE: Contiguous axial images were obtained from the base of the skull through the vertex without intravenous contrast.  COMPARISON:  CTA of the head performed 10/11/2013  FINDINGS: There is no evidence of acute infarction, mass lesion, or intra- or extra-axial hemorrhage on CT.  Diffuse periventricular and subcortical white matter change likely reflects small vessel ischemic microangiopathy. Scattered chronic lacunar infarcts are seen within the basal ganglia bilaterally.  The posterior fossa, including the cerebellum, brainstem and fourth ventricle, is within normal limits. The third and lateral ventricles are unremarkable in appearance. The cerebral hemispheres demonstrate grossly normal gray-white differentiation. No mass effect or midline shift is seen.  There is no evidence of fracture; visualized osseous structures are unremarkable in appearance. The visualized portions  of the orbits are within normal limits. The paranasal sinuses and mastoid air cells are well-aerated. No significant soft tissue abnormalities are seen.  IMPRESSION: 1. No acute intracranial pathology seen on CT. 2. Diffuse small vessel ischemic microangiopathy again noted; scattered chronic lacunar infarcts within the basal ganglia bilaterally.   Electronically Signed   By: Roanna RaiderJeffery  Chang M.D.   On: 10/12/2013 03:26     CXR:  Normal chest X ray  ASSESSMENT / PLAN:  PULMONARY A: 1) No issues. Protecting her  airway. P:    CARDIOVASCULAR A:  1) Intermittent rapid A. Fib 2) NSTEMI - peak troponin 4.8  with non specific EKG changes. No chest pain P:  - dc amiodarone drip -use lopressor 5mg  q6h prn - As per neurology, we will try to avoid anticoagulation given the size of the stroke and risk for bleeding. If troponin trending up and absolutely necessary from the Cardiology standpoint we would do careful Heparin drip  with no bolus.  RENAL A:   1) Mild acute renal failure, mildly elevated CK 2) Hypokalemia 3) Hypernatremia P:   - IVF with D5//NS with 40 meq of KCL at 50cc/hr - Will follow BMP in am  GASTROINTESTINAL A:   1) GERD P:   - NPO-swallow eval - Pepcid 20 mg IV q12h  HEMATOLOGIC A:   1) Anemia, unclear etiology. Will need workup P:  - Will follow CBC  INFECTIOUS A:   1) No issues P:    ENDOCRINE A:   1) No history of DM, Hyperglycemia P:   - Novolog sliding scale - Hgb A1c  NEUROLOGIC A:   1) Acute ischemic left brain stroke. Out of window for TPA P:   - Neurology help appreciated. - MRI of the brain wo contrast - Carotid doppler - Echocardiogram - PT/OT - Speech / swallow evaluation - Frequent neuro checks  - Aspirin 300 mg per rectum   TODAY'S SUMMARY: reverted to nSR, dc amio & use lopressor , can transfer to SDU & ask stroke service to assume care, will get Card input  I have personally obtained a history, examined the patient, evaluated laboratory and imaging results, formulated the assessment and plan and placed orders. CRITICAL CARE: The patient is critically ill with multiple organ systems failure and requires high complexity decision making for assessment and support, frequent evaluation and titration of therapies, application of advanced monitoring technologies and extensive interpretation of multiple databases. Critical Care Time devoted to patient care services described in this note is 31 minutes.   Arretta Toenjes V.   10/12/2013, 9:51  AM

## 2013-10-12 NOTE — Progress Notes (Signed)
*  PRELIMINARY RESULTS* Vascular Ultrasound Carotid Duplex (Doppler) has been completed.   Findings suggest 60-79% right internal carotid artery stenosis, and 1-39% left internal carotid artery stenosis. Vertebral arteries are patent with antegrade flow.  10/12/2013 3:40 PM Gertie FeyMichelle Wendell Nicoson, RVT, RDCS, RDMS

## 2013-10-12 NOTE — Progress Notes (Signed)
PULMONARY / CRITICAL CARE MEDICINE   Name: Wendy Malone MRN: [id number] DOB: 1940/09/01    ADMISSION DATE:  10/11/2013  PRIMARY SERVICE: PCCM  CHIEF COMPLAINT:  Ischemic stroke  BRIEF PATIENT DESCRIPTION: 73 yo with HTN presenting 2/28 with right hemiparesis and facial droop.  Intermittent AF/RVR, placed on Amiodarone gtt.  DNI/DNR.  SIGNIFICANT EVENTS / STUDIES:  2/28  Head CT >>> Indeterminate age basal ganglia infarcts, multiple stenotic segments of cerebral blood vessels  3/1    Head CT >>> Scattered lacunar infarcts in basal ganglia, diffuse ischemic changes 3/1    Brain MRI >>> Acute infarct  L basal ganglia / corona radiata, additional remote infarcts 3/1    TTE >>>  EF 60-65%, grade 1 diastolic dysfunction  LINES / TUBES:  CULTURES:  ANTIBIOTICS:  INTERVAL HISTORY:  VITAL SIGNS: Temp:  [98 F (36.7 C)-98.9 F (37.2 C)] 98.2 F (36.8 C) (03/02 0800) Pulse Rate:  [70-100] 88 (03/02 1100) Resp:  [15-32] 20 (03/02 1100) BP: (133-183)/(53-79) 150/53 mmHg (03/02 1100) SpO2:  [93 %-100 %] 97 % (03/02 1100)  HEMODYNAMICS:    VENTILATOR SETTINGS:   INTAKE / OUTPUT: Intake/Output     03/01 0701 - 03/02 0700 03/02 0701 - 03/03 0700   I.V. (mL/kg) 1200.1 (18.9) 200 (3.2)   IV Piggyback 50    Total Intake(mL/kg) 1250.1 (19.7) 200 (3.2)   Urine (mL/kg/hr) 1040 (0.7) 125 (0.5)   Total Output 1040 125   Net +210.1 +75         PHYSICAL EXAMINATION: General:  Resting comfortable, no distress Neuro:  Sleepy, but arouses to stimulation, R hemiplegia HEENT:  PERRL, diminished gag / cough Cardiovascular:  RRR, no m/r/g Lungs:  Bilateral rhonchi, partially clearing with cough Abdomen:  Soft, nontender, bowel sounds diminished Musculoskeletal:  No edema Skin:  Intact  LABS:  CBC  Recent Labs Lab 10/11/13 1857 10/13/13 0330  WBC 8.9 8.2  HGB 9.6* 7.7*  HCT 28.1* 23.7*  PLT 198 199   Coag's  Recent Labs Lab 10/11/13 1857  APTT 35  INR 1.09    BMET  Recent Labs Lab 10/11/13 1857 10/12/13 0500 10/13/13 0330  NA 148* 149* 146  K 3.2* 4.2 4.9  CL 98 108 111  CO2 25 29 23   BUN 28* 38* 42*  CREATININE 1.19* 1.41* 1.99*  GLUCOSE 155* 114* 138*   Electrolytes  Recent Labs Lab 10/11/13 1857 10/12/13 0500 10/13/13 0330  CALCIUM 9.4 8.8 9.1   Sepsis Markers No results found for this basename: LATICACIDVEN, PROCALCITON, O2SATVEN,  in the last 168 hours  ABG No results found for this basename: PHART, PCO2ART, PO2ART,  in the last 168 hours  Liver Enzymes  Recent Labs Lab 10/11/13 1857  AST 53*  ALT 20  ALKPHOS 68  BILITOT 0.4  ALBUMIN 3.2*   Cardiac Enzymes  Recent Labs Lab 10/11/13 2206 10/12/13 0327 10/12/13 1450  TROPONINI 4.85* 4.45* 2.36*   Glucose  Recent Labs Lab 10/12/13 1119 10/12/13 1535 10/12/13 1910 10/12/13 2356 10/13/13 0400 10/13/13 0810  GLUCAP 103* 121* 111* 139* 125* 123*   IMAGING:   Ct Angio Head W/cm &/or Wo Cm  10/11/2013   CLINICAL DATA:  Code stroke. Right-sided weakness and slurred speech.  EXAM: CT ANGIOGRAPHY HEAD  TECHNIQUE: Multidetector CT imaging of the head was performed using the standard protocol during bolus administration of intravenous contrast. Multiplanar CT image reconstructions and MIPs were obtained to evaluate the vascular anatomy.  CONTRAST:  50mL OMNIPAQUE IOHEXOL 350 MG/ML  SOLN  COMPARISON:  None.  FINDINGS: Gray-white differentiation is preserved the source images and postcontrast images without evidence for a focal cortical infarct. There are multiple lacunar infarcts within the basal ganglia bilaterally which are age-indeterminate. Extensive periventricular white matter hypoattenuation is present bilaterally.  No hemorrhage or mass lesion is evident.  The cervical internal carotid arteries are within normal limits bilaterally. Calcified and noncalcified plaque is present within the cavernous carotid arteries bilaterally. There is a moderate stenosis  of the right cavernous carotid artery just proximal to the genu. Mild to moderate narrowing is present in the supra clinoid left internal carotid artery. Supra clinoid segments are normal bilaterally. The A1 and M1 segments are normal bilaterally. The anterior communicating artery is patent.  Segmental narrowing within the pericallosal arteries is worse on the right. There is segmental narrowing of distal MCA branch vessels bilaterally, right greater than left.  The left vertebral artery is dominant. The right PICA origin is visualized and normal. The left AICA is dominant. The basilar artery is within normal limits. Both posterior cerebral arteries originate from the basilar tip. Segmental stenoses are present in the proximal PCA branch vessels bilaterally. The dural sinuses are patent.  Review of the MIP images confirms the above findings.  IMPRESSION: 1. No acute or subacute cortical infarct is evident. 2. Bilateral basal ganglia infarcts are age indeterminate. 3. 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 4. Segmental stenoses within the PCA branch vessels bilaterally and right greater than left MCA branch vessels. 5. Segmental stenoses within the pericallosal arteries, right greater than left.   Electronically Signed   By: Gennette Pac M.D.   On: 10/11/2013 21:31   Dg Chest 2 View  10/11/2013   CLINICAL DATA:  Stroke.  EXAM: CHEST  2 VIEW  COMPARISON:  None.  FINDINGS: Cardiac silhouette is mildly enlarged. Normal mediastinal and hilar contours.  There are prominent bronchovascular markings. Lungs are otherwise clear. No pleural effusion or pneumothorax.  The bony thorax is intact.  IMPRESSION: No acute cardiopulmonary disease.   Electronically Signed   By: Amie Portland M.D.   On: 10/11/2013 21:03   Ct Head Wo Contrast  10/12/2013   CLINICAL DATA:  Altered mental status.  Unable to speak.  EXAM: CT HEAD WITHOUT CONTRAST  TECHNIQUE: Contiguous axial images were obtained  from the base of the skull through the vertex without intravenous contrast.  COMPARISON:  CTA of the head performed 10/11/2013  FINDINGS: There is no evidence of acute infarction, mass lesion, or intra- or extra-axial hemorrhage on CT.  Diffuse periventricular and subcortical white matter change likely reflects small vessel ischemic microangiopathy. Scattered chronic lacunar infarcts are seen within the basal ganglia bilaterally.  The posterior fossa, including the cerebellum, brainstem and fourth ventricle, is within normal limits. The third and lateral ventricles are unremarkable in appearance. The cerebral hemispheres demonstrate grossly normal gray-white differentiation. No mass effect or midline shift is seen.  There is no evidence of fracture; visualized osseous structures are unremarkable in appearance. The visualized portions of the orbits are within normal limits. The paranasal sinuses and mastoid air cells are well-aerated. No significant soft tissue abnormalities are seen.  IMPRESSION: 1. No acute intracranial pathology seen on CT. 2. Diffuse small vessel ischemic microangiopathy again noted; scattered chronic lacunar infarcts within the basal ganglia bilaterally.   Electronically Signed   By: Roanna Raider M.D.   On: 10/12/2013 03:26   Mr Brain Wo Contrast  10/12/2013   CLINICAL  DATA:  Slurred speech. Ischemic stroke. Right hemiparesis and facial droop.  EXAM: MRI HEAD WITHOUT CONTRAST  TECHNIQUE: Multiplanar, multiecho pulse sequences of the brain and surrounding structures were obtained without intravenous contrast.  COMPARISON:  CT head without contrast 10/12/2013.  FINDINGS: Diffusion-weighted images demonstrate an acute nonhemorrhagic infarct involving the posterior left lentiform nucleus extending into the body of caudate and coronal radiata. The area measures 3.2 x 2.5 x 1.4 cm. The area is present within the region of diffuse white matter disease. Advanced atrophy and extensive white matter  changes are present bilaterally. Remote lacunar infarcts are present in the brainstem and inferior cerebellum bilaterally. Remote lacunar infarcts are present in the basal ganglia bilaterally as well.  Flow is present in the major intracranial arteries. The globes and orbits are intact. Mild mucosal thickening is present in the sphenoid sinuses bilaterally. There is minimal mucosal thickening in the ethmoid air cells and inferior left frontal sinus. The mastoid air cells are clear.  IMPRESSION: 1. Acute nonhemorrhagic infarct involving the posterior left lentiform nucleus extending superiorly into the corona radiata. 2. Additional remote lacunar infarcts are present in the basal ganglia, brainstem, inferior cerebellum bilaterally. 3. Atrophy and diffuse white matter changes are present bilaterally including the area of acute infarct. This likely reflects the sequelae of chronic microvascular ischemia. 4. Minimal sinus disease. These results were called by telephone at the time of interpretation on 10/12/2013 at 11:55 AM to Dr. Loretha Brasil, who verbally acknowledged these results.   Electronically Signed   By: Gennette Pac M.D.   On: 10/12/2013 11:58   ASSESSMENT / PLAN:  PULMONARY A: No active issues P:   Goal SpO2>92 Supplemental oxygen PRN DNI  CARDIOVASCULAR A:  AF/RVR now SR NSTEMI P:  Lopressor scheduled and PRN Hydralazine PRN Avoid Heparin due to risk of hemorrhagic conversion ASA DNR  RENAL A:   AKI P:   Trend BMP NS@75  Ns 500 x 1  GASTROINTESTINAL A:   GERD Nutrition P:   NPO Swallow eval Pepcid  HEMATOLOGIC A:   Anemia P:  Trend CBC  INFECTIOUS A:   No active issues P:   No intervention required  ENDOCRINE A:   Hyperglycemia P:   SSI  NEUROLOGIC A:   Acute ischemic left basal ganglia stroke P:   Per Neurology  Transfer to telemetry 3/2. PCCM will sign off. TRH to pick up 3/3.  I have personally obtained history, examined patient, evaluated and  interpreted laboratory and imaging results, reviewed medical records, formulated assessment / plan and placed orders.  Lonia Farber, MD Pulmonary and Critical Care Medicine [facility] Pager: [phone]  10/13/2013, 11:22 AM

## 2013-10-12 NOTE — Progress Notes (Addendum)
Stroke Team Progress Note  HISTORY 73 y.o. female who went to bed normal last evening. Was found on the floor by her daughter yesterday. Patient is unclear about the details of what happened. Patient was initially taken to [facility] where the patient reported that she thought she got dizzy and fell the day before. Was initially evaluated as being able to move all extremities but speech was altered and patient was having trouble getting her words out. While being further evaluated in the ED the patient was noted to have right sided weakness. SOC was contacted and it was felt that the patient would benefit from the additional services available to her at a Stroke Center. Patient was transferred to [facility] at that time. Initial NIHSS on presentation to [facility] of 18. While being evaluated at [facility] patient also noted to have an elevated troponin. She was given a bolus of Heparin. Also noted to be in atrial fibrillation.  Not TPA candidate as outside TPA window as well as outside of interventional.   SUBJECTIVE Neglects L side, hemiplegia R side.    OBJECTIVE Most recent Vital Signs: Filed Vitals:   10/12/13 0700 10/12/13 0733 10/12/13 0800 10/12/13 0900  BP: 147/67  150/47 193/61  Pulse: 64  65 71  Temp:  98.3 F (36.8 C)    TempSrc:  Oral    Resp: 15  16 19   Height:      Weight:      SpO2: 100%  98% 100%   CBG (last 3)   Recent Labs  10/11/13 2206 10/12/13 0027 10/12/13 0417  GLUCAP 138* 145* 128*    IV Fluid Intake:   . amiodarone (NEXTERONE PREMIX) 360 mg/200 mL dextrose 30 mg/hr (10/12/13 0646)  . dextrose 5 % and 0.9 % NaCl with KCl 40 mEq/L 100 mL/hr at 10/12/13 0800    MEDICATIONS  . antiseptic oral rinse  15 mL Mouth Rinse BID  . aspirin  300 mg Rectal Daily  . famotidine (PEPCID) IV  20 mg Intravenous Q12H  . heparin  5,000 Units Subcutaneous 3 times per day  . insulin aspart  1-3 Units Subcutaneous 6 times per day   PRN:  senna-docusate  Diet:  NPO  Activity:   Bedrest DVT Prophylaxis:  SCDs  CLINICALLY SIGNIFICANT STUDIES Basic Metabolic Panel:  Recent Labs Lab 10/11/13 1857 10/12/13 0500  NA 148* 149*  K 3.2* 4.2  CL 98 108  CO2 25 29  GLUCOSE 155* 114*  BUN 28* 38*  CREATININE 1.19* 1.41*  CALCIUM 9.4 8.8   Liver Function Tests:  Recent Labs Lab 10/11/13 1857  AST 53*  ALT 20  ALKPHOS 68  BILITOT 0.4  PROT 6.1  ALBUMIN 3.2*   CBC:  Recent Labs Lab 10/11/13 1857  WBC 8.9  NEUTROABS 7.1  HGB 9.6*  HCT 28.1*  MCV 87.3  PLT 198   Coagulation:  Recent Labs Lab 10/11/13 1857  LABPROT 13.9  INR 1.09   Cardiac Enzymes:  Recent Labs Lab 10/11/13 1857 10/11/13 2206 10/12/13 0327  CKTOTAL 710*  --   --   CKMB 18.8*  --   --   TROPONINI 4.15* 4.85* 4.45*   Urinalysis: No results found for this basename: COLORURINE, APPERANCEUR, LABSPEC, PHURINE, GLUCOSEU, HGBUR, BILIRUBINUR, KETONESUR, PROTEINUR, UROBILINOGEN, NITRITE, LEUKOCYTESUR,  in the last 168 hours Lipid Panel    Component Value Date/Time   CHOL 177 10/12/2013 0500   TRIG 121 10/12/2013 0500   HDL 60 10/12/2013 0500   CHOLHDL 3.0  10/12/2013 0500   VLDL 24 10/12/2013 0500   LDLCALC 93 10/12/2013 0500   HgbA1C  No results found for this basename: HGBA1C    Urine Drug Screen:   No results found for this basename: labopia, cocainscrnur, labbenz, amphetmu, thcu, labbarb    Alcohol Level: No results found for this basename: ETH,  in the last 168 hours  Ct Angio Head W/cm &/or Wo Cm  10/11/2013   CLINICAL DATA:  Code stroke. Right-sided weakness and slurred speech.  EXAM: CT ANGIOGRAPHY HEAD  TECHNIQUE: Multidetector CT imaging of the head was performed using the standard protocol during bolus administration of intravenous contrast. Multiplanar CT image reconstructions and MIPs were obtained to evaluate the vascular anatomy.  CONTRAST:  50mL OMNIPAQUE IOHEXOL 350 MG/ML SOLN  COMPARISON:  None.  FINDINGS: Gray-white differentiation is preserved the source images and  postcontrast images without evidence for a focal cortical infarct. There are multiple lacunar infarcts within the basal ganglia bilaterally which are age-indeterminate. Extensive periventricular white matter hypoattenuation is present bilaterally.  No hemorrhage or mass lesion is evident.  The cervical internal carotid arteries are within normal limits bilaterally. Calcified and noncalcified plaque is present within the cavernous carotid arteries bilaterally. There is a moderate stenosis of the right cavernous carotid artery just proximal to the genu. Mild to moderate narrowing is present in the supra clinoid left internal carotid artery. Supra clinoid segments are normal bilaterally. The A1 and M1 segments are normal bilaterally. The anterior communicating artery is patent.  Segmental narrowing within the pericallosal arteries is worse on the right. There is segmental narrowing of distal MCA branch vessels bilaterally, right greater than left.  The left vertebral artery is dominant. The right PICA origin is visualized and normal. The left AICA is dominant. The basilar artery is within normal limits. Both posterior cerebral arteries originate from the basilar tip. Segmental stenoses are present in the proximal PCA branch vessels bilaterally. The dural sinuses are patent.  Review of the MIP images confirms the above findings.  IMPRESSION: 1. No acute or subacute cortical infarct is evident. 2. Bilateral basal ganglia infarcts are age indeterminate. 3. 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 4. Segmental stenoses within the PCA branch vessels bilaterally and right greater than left MCA branch vessels. 5. Segmental stenoses within the pericallosal arteries, right greater than left.   Electronically Signed   By: Gennette Pachris  Mattern M.D.   On: 10/11/2013 21:31   Dg Chest 2 View  10/11/2013   CLINICAL DATA:  Stroke.  EXAM: CHEST  2 VIEW  COMPARISON:  None.  FINDINGS: Cardiac silhouette is  mildly enlarged. Normal mediastinal and hilar contours.  There are prominent bronchovascular markings. Lungs are otherwise clear. No pleural effusion or pneumothorax.  The bony thorax is intact.  IMPRESSION: No acute cardiopulmonary disease.   Electronically Signed   By: Amie Portlandavid  Ormond M.D.   On: 10/11/2013 21:03   Ct Head Wo Contrast  10/12/2013   CLINICAL DATA:  Altered mental status.  Unable to speak.  EXAM: CT HEAD WITHOUT CONTRAST  TECHNIQUE: Contiguous axial images were obtained from the base of the skull through the vertex without intravenous contrast.  COMPARISON:  CTA of the head performed 10/11/2013  FINDINGS: There is no evidence of acute infarction, mass lesion, or intra- or extra-axial hemorrhage on CT.  Diffuse periventricular and subcortical white matter change likely reflects small vessel ischemic microangiopathy. Scattered chronic lacunar infarcts are seen within the basal ganglia bilaterally.  The posterior  fossa, including the cerebellum, brainstem and fourth ventricle, is within normal limits. The third and lateral ventricles are unremarkable in appearance. The cerebral hemispheres demonstrate grossly normal gray-white differentiation. No mass effect or midline shift is seen.  There is no evidence of fracture; visualized osseous structures are unremarkable in appearance. The visualized portions of the orbits are within normal limits. The paranasal sinuses and mastoid air cells are well-aerated. No significant soft tissue abnormalities are seen.  IMPRESSION: 1. No acute intracranial pathology seen on CT. 2. Diffuse small vessel ischemic microangiopathy again noted; scattered chronic lacunar infarcts within the basal ganglia bilaterally.   Electronically Signed   By: Roanna Raider M.D.   On: 10/12/2013 03:26    CT of the brain  No acute intracranial pathology seen on CT   MRI of the brain    MRA of the brain    2D Echocardiogram    Carotid Doppler    CXR    EKG  A-fib with RVR and  now in sinus rhythm.   Therapy Recommendations pending  Physical Exam   Neurologic Examination:  Mental Status:  Alert, oriented, thought content appropriate. Speech fluent without evidence of aphasia but slurred. Able to follow simple commands without difficulty.  Cranial Nerves:  II: Discs flat bilaterally; Visual fields grossly normal, pupils equal, round, reactive to light and accommodation  III,IV, VI: ptosis not present, patient unable to go beyond midline to the right  V,VII: right facial droop, facial light touch sensation decreased on the right  VIII: hearing normal bilaterally  IX,X: gag reflex reduced  XI: shoulder shrug decreased on the right  XII: midline tongue extension  Motor:  Right : Upper extremity 0/5 Left: Upper extremity 5/5  Lower extremity 0/5 Lower extremity 5/5  Tone and bulk:normal tone throughout; no atrophy noted  Sensory: Pinprick and light touch decreased on the right  Deep Tendon Reflexes: 2+ throughout with an absent left ankle jerk  Plantars:  Right: upgoing Left: mute  Cerebellar:  normal finger-to-nose and normal heel-to-shin testing on the left. Unable to perform on the right  Gait: Unable to test due to weakness  CV: pulses palpable throughout    ASSESSMENT 73 y.o. female presenting with slurred speech, left eye preference and right hemiparesis as well as R sided neglect.  Patient has elevated troponins that have trended down to 4 from initial   6. Head CT reviewed and shows no acute changes. Patient outside treatment window for both intervention and tPA.    A- fib   Elevated troponin   Hospital day # 1  TREATMENT/PLAN  ASA suppository   Speech eval  PT/OT in near future as I don't think she will be a candidate for therapy today  Keep in ICU for 1 more day and then consider step down tomorrow  A-fib converted to sinus, off amio now.   Heparin for DVT prophylaxis  Cardiology evaluation for elevated troponin ( received heparin  at OSH for suspected NSTEMI) held now.  Trend troponin  If needed to be placed on heparin would try to hold off for 3 days and then start low dose no bolus. Pt will have high risk of hemorrhagic conversion as suspect significant infarct size with NIHSS of 18  MRI  2decho, carotids  Statin therapy.    DNR/DNI  D/w critical care team 10/12/2013 9:57 AM  I have personally obtained a history, examined the patient, evaluated imaging results, and formulated the assessment and plan of care. I agree with  the above.  Addendum: MRI brain shows Acute nonhemorrhagic infarct involving the posterior left lentiform nucleus extending superiorly into the corona radiata. . Additional remote lacunar infarcts are present in the basal ganglia, brainstem, inferior cerebellum bilaterally.  Stroke size smaller then initially suspected based on severity of symptoms. Lack of herniation from swelling is low. If needed can be transferred to step down.   Appreciate cardiology eval. Would hold anticoagulation for another 3 days if possible.  Pauletta Browns

## 2013-10-12 NOTE — Consult Note (Signed)
CARDIOLOGY CONSULT NOTE   Patient ID: Wendy Malone MRN: [id number], DOB/AGE: October 07, 1940   Admit date: 10/11/2013 Date of Consult: 10/12/2013  Primary Physician: No primary provider on file. Primary Cardiologist: New to [facility] Reason for Consultation: Positive troponin and newly diagnosed atrial fibrillation  History of Present Illness Wendy Malone is a 73 y.o. female with HTN and GERD who presented yesterday to [facility] with acute onset slurred speech, right sided hemiparesis and facial droop, found to have acute left brain CVA. She was transferred to [facility]. She has had intermittent atrial fibrillation while here and also has a positive troponin; therefore, Cardiology has been asked to see and provide recommendations. Wendy Malone is accompanied by her daughter who is at bedside and assists with history questions. Wendy Malone denies CP, SOB or palpitations and her daughter confirms that she has never complained of these symptoms. But states that she "also doesn't go the doctor regularly so probably wouldn't tell me." She denies history of CAD/MI, valvular heart disease, CHF or arrhythmias. She has never had stress testing or seen a cardiologist before. She was recently diagnosed with hypertension and dyslipidemia Dec 2014 and started on lisinopril, metoprolol and aspirin. Lisinopril was stopped last month due to side effects of fatigue and decreased energy per daughter. At [facility], her troponin was 6.9. Here it is 4.85 and trending down. She is in SR currently but on review of her telemetry she had rapid atrial fibrillation overnight ~midnight. This was asymptomatic. She was given IV amiodarone briefly. Echo is pending.   Past Medical History Past Medical History  Diagnosis Date  . Hypertension     since December 2014  . GERD (gastroesophageal reflux disease)   . Macular degeneration     Past Surgical History None. No surgeries.   Allergies/Intolerances No Known Allergies  Current Home  Medications ASK your doctor about these medications       aspirin 81 MG tablet  Take 81 mg by mouth daily.     metoprolol tartrate 25 MG tablet  Commonly known as:  LOPRESSOR  Take 25 mg by mouth 2 (two) times daily.     ranitidine 150 MG tablet  Commonly known as:  ZANTAC  Take 150 mg by mouth daily.     Inpatient Medications . antiseptic oral rinse  15 mL Mouth Rinse BID  . aspirin  300 mg Rectal Daily  . [START ON 10/13/2013] famotidine (PEPCID) IV  20 mg Intravenous Q24H  . heparin  5,000 Units Subcutaneous 3 times per day  . insulin aspart  1-3 Units Subcutaneous 6 times per day  . metoprolol  5 mg Intravenous 4 times per day   . dextrose 5 % and 0.9 % NaCl with KCl 40 mEq/L 50 mL/hr at 10/12/13 1126   Family History Positive for CAD   Social History History   Social History  . Marital Status: Single    Spouse Name: N/A    Number of Children: N/A  . Years of Education: N/A   Occupational History  . Not on file.   Social History Main Topics  . Smoking status: Former Smoker    Quit date: 08/10/2013  . Smokeless tobacco: Not on file  . Alcohol Use: No  . Drug Use: No  . Sexual Activity: Not on file   Other Topics Concern  . Not on file   Social History Narrative  . No narrative on file     Review of Systems General: No chills, fever, night sweats  or weight changes  Cardiovascular:  No chest pain, dyspnea on exertion, edema, orthopnea, palpitations, paroxysmal nocturnal dyspnea Dermatological: No rash, lesions or masses Respiratory: No cough, dyspnea Urologic: No hematuria, dysuria Abdominal: No nausea, vomiting, diarrhea, bright red blood per rectum, melena, or hematemesis Neurologic: No visual changes, weakness, changes in mental status All other systems reviewed and are otherwise negative except as noted above.  Physical Exam Vitals: Blood pressure 182/69, pulse 70, temperature 98 F (36.7 C), temperature source Oral, resp. rate 18, height 5'  (1.524 m), weight 139 lb 12.4 oz (63.4 kg), SpO2 100.00%.  General: Well developed 73 y.o. female in no acute distress. HEENT: Normocephalic, atraumatic. Sclera nonicteric. Oropharynx clear.  Neck: Supple. No JVD. Lungs: Respirations regular and unlabored, CTA bilaterally. No wheezes, rales or rhonchi. Heart: RRR. S1, S2 present. No murmurs, rub, S3 or S4. Abdomen: Soft, non-tender, non-distended. BS present x 4 quadrants. No hepatosplenomegaly.  Extremities: No clubbing, cyanosis or edema. DP/PT/Radials 2+ and equal bilaterally. Neuro: Alert. Dysarthritic. Facial droop noted and right sided hemiparesis.  Musculoskeletal: No kyphosis. Skin: Intact. Warm and dry. No rashes or petechiae in exposed areas.   Labs  Recent Labs  10/11/13 1857 10/11/13 2206 10/12/13 0327  CKTOTAL 710*  --   --   CKMB 18.8*  --   --   TROPONINI 4.15* 4.85* 4.45*   Lab Results  Component Value Date   WBC 8.9 10/11/2013   HGB 9.6* 10/11/2013   HCT 28.1* 10/11/2013   MCV 87.3 10/11/2013   PLT 198 10/11/2013     Recent Labs Lab 10/11/13 1857 10/12/13 0500  NA 148* 149*  K 3.2* 4.2  CL 98 108  CO2 25 29  BUN 28* 38*  CREATININE 1.19* 1.41*  CALCIUM 9.4 8.8  PROT 6.1  --   BILITOT 0.4  --   ALKPHOS 68  --   ALT 20  --   AST 53*  --   GLUCOSE 155* 114*   Lab Results  Component Value Date   CHOL 177 10/12/2013   HDL 60 10/12/2013   LDLCALC 93 10/12/2013   TRIG 121 10/12/2013    Recent Labs  10/11/13 1857  INR 1.09    Radiology/Studies Ct Angio Head W/cm &/or Wo Cm 10/11/2013    FINDINGS: Gray-white differentiation is preserved the source images and postcontrast images without evidence for a focal cortical infarct. There are multiple lacunar infarcts within the basal ganglia bilaterally which are age-indeterminate. Extensive periventricular white matter hypoattenuation is present bilaterally.  No hemorrhage or mass lesion is evident.  The cervical internal carotid arteries are within normal limits  bilaterally. Calcified and noncalcified plaque is present within the cavernous carotid arteries bilaterally. There is a moderate stenosis of the right cavernous carotid artery just proximal to the genu. Mild to moderate narrowing is present in the supra clinoid left internal carotid artery. Supra clinoid segments are normal bilaterally. The A1 and M1 segments are normal bilaterally. The anterior communicating artery is patent.  Segmental narrowing within the pericallosal arteries is worse on the right. There is segmental narrowing of distal MCA branch vessels bilaterally, right greater than left.  The left vertebral artery is dominant. The right PICA origin is visualized and normal. The left AICA is dominant. The basilar artery is within normal limits. Both posterior cerebral arteries originate from the basilar tip. Segmental stenoses are present in the proximal PCA branch vessels bilaterally. The dural sinuses are patent.  Review of the MIP images confirms the above findings.  IMPRESSION: 1. No acute or subacute cortical infarct is evident. 2. Bilateral basal ganglia infarcts are age indeterminate. 3. 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 4. Segmental stenoses within the PCA branch vessels bilaterally and right greater than left MCA branch vessels. 5. Segmental stenoses within the pericallosal arteries, right greater than left.    Electronically Signed   By: Gennette Pachris  Mattern M.D.   On: 10/11/2013 21:31   Dg Chest 2 View 10/11/2013    FINDINGS: Cardiac silhouette is mildly enlarged. Normal mediastinal and hilar contours.  There are prominent bronchovascular markings. Lungs are otherwise clear. No pleural effusion or pneumothorax.  The bony thorax is intact.   IMPRESSION: No acute cardiopulmonary disease.    Electronically Signed   By: Amie Portlandavid  Ormond M.D.   On: 10/11/2013 21:03   Ct Head Wo Contrast 10/12/2013    FINDINGS: There is no evidence of acute infarction, mass  lesion, or intra- or extra-axial hemorrhage on CT.  Diffuse periventricular and subcortical white matter change likely reflects small vessel ischemic microangiopathy. Scattered chronic lacunar infarcts are seen within the basal ganglia bilaterally.  The posterior fossa, including the cerebellum, brainstem and fourth ventricle, is within normal limits. The third and lateral ventricles are unremarkable in appearance. The cerebral hemispheres demonstrate grossly normal gray-white differentiation. No mass effect or midline shift is seen.  There is no evidence of fracture; visualized osseous structures are unremarkable in appearance. The visualized portions of the orbits are within normal limits. The paranasal sinuses and mastoid air cells are well-aerated. No significant soft tissue abnormalities are seen.   IMPRESSION: 1. No acute intracranial pathology seen on CT. 2. Diffuse small vessel ischemic microangiopathy again noted; scattered chronic lacunar infarcts within the basal ganglia bilaterally.    Electronically Signed   By: Roanna RaiderJeffery  Chang M.D.   On: 10/12/2013 03:26   Mr Brain Wo Contrast 10/12/2013    FINDINGS: Diffusion-weighted images demonstrate an acute nonhemorrhagic infarct involving the posterior left lentiform nucleus extending into the body of caudate and coronal radiata. The area measures 3.2 x 2.5 x 1.4 cm. The area is present within the region of diffuse white matter disease. Advanced atrophy and extensive white matter changes are present bilaterally. Remote lacunar infarcts are present in the brainstem and inferior cerebellum bilaterally. Remote lacunar infarcts are present in the basal ganglia bilaterally as well.  Flow is present in the major intracranial arteries. The globes and orbits are intact. Mild mucosal thickening is present in the sphenoid sinuses bilaterally. There is minimal mucosal thickening in the ethmoid air cells and inferior left frontal sinus. The mastoid air cells are clear.    IMPRESSION: 1. Acute nonhemorrhagic infarct involving the posterior left lentiform nucleus extending superiorly into the corona radiata. 2. Additional remote lacunar infarcts are present in the basal ganglia, brainstem, inferior cerebellum bilaterally. 3. Atrophy and diffuse white matter changes are present bilaterally including the area of acute infarct. This likely reflects the sequelae of chronic microvascular ischemia. 4. Minimal sinus disease. These results were called by telephone at the time of interpretation on 10/12/2013 at 11:55 AM to Dr. Loretha BrasilZEYLIKMAN, who verbally acknowledged these results.    Electronically Signed   By: Gennette Pachris  Mattern M.D.   On: 10/12/2013 11:58    Echocardiogram  Pending  12-lead ECG on [facility] (see paper chart) - NSR at 89 bpm with LVH, minimal ST depression V3-V5, question strain 12-lead ECG on arrival here yesterday - SR with PACs at 82 bpm with  LVH, ST depression V3-V5, question strain (of note, the computer read is atrial fibrillation which is incorrect) Telemetry reviewed in detail - predominantly SR while here but with intermittent rapid atrial fibrillation ~midnight last night   Assessment and Plan 1. Acute ischemic CVA 2. NSTEMI 3. Newly diagnosed atrial fibrillation 4. HTN 5. Renal insufficiency 6. Anemia  Ms. Baysinger presents with an acute ischemic CVA. She has also ruled in for NSTEMI and has had intermittent rapid atrial fibrillation which is new. She does not report CP, SOB or palpitations. For now, no anticoagulation per Neuro due to high risk for hemorrhagic conversion due to size of infarct. She will need chronic anticoagulation once stable for Neuro standpoint. Agree with continuing metoprolol for now for rate control. She is currently in SR. Will follow telemetry. Will check thyroid panel. Troponin elevation may not be surprising in setting of large acute CVA, hypertensive urgency and LVH. However, cannot exclude cardiac ischemia as a  concurrent problem. Echo has been done and will be reviewed. If LVEF is normal, would plan to do [facility] for cardiac risk stratification once stable for Neuro standpoint.   Signed, Rick Duff, PA-C 10/12/2013, 1:04 PM  Patient seen with PA, agree with the above note.  She is currently in NSR.  She has paroxysmal atrial fibrillation, which she does not notice.  She had a large CVA likely related to atrial fibrillation.  She was also noted to have elevated troponin.  1. Atrial fibrillation: Paroxysmal.  Would treat with metoprolol for now, can eventually convert over to Toprol XL.  She is not symptomatic from atrial fibrillation.  She will need to be anticoagulated, likely by NOAC, when cleared by neuro (holding off for right now given large CVA and concern for hemorrhagic conversion).  She is also anemic and should have workup for this while she is here prior to starting anticoagulation: stool guaiac, Fe studies, B12, etc.  2. Elevated troponin: No chest pain.  Etiology uncertain.  ECG shows LVH with strain pattern.  No prior cardiac history.  Cannot rule out pre-existing ACS with CVA.  Can also have a demand/Takotsubo-type event accompanying CVA with rise in TnI.  Will review echo today.  If EF normal, likely will favor Cardiolite.  If EF decreased or wall motion abnormalities, favor eventual cardiac cath.   Marca Ancona 10/12/2013 1:55 PM

## 2013-10-12 NOTE — Progress Notes (Signed)
PT Cancellation Note  Patient Details Name: Wendy Malone MRN: [id number] DOB: 1941-01-24   Cancelled Treatment:    Reason Eval Not Completed: 1) Patient at procedure or test/unavailable (MRI); 2)Patient not medically ready per neurologist's note of 10/12/13. Will attempt evaluation 10/13/13. Thank you   [name] 10/12/2013, 12:01 PM Pager [phone]

## 2013-10-13 ENCOUNTER — Encounter (HOSPITAL_COMMUNITY): Payer: Self-pay

## 2013-10-13 LAB — BASIC METABOLIC PANEL
BUN: 42 mg/dL — ABNORMAL HIGH (ref 6–23)
CALCIUM: 9.1 mg/dL (ref 8.4–10.5)
CO2: 23 mEq/L (ref 19–32)
Chloride: 111 mEq/L (ref 96–112)
Creatinine, Ser: 1.99 mg/dL — ABNORMAL HIGH (ref 0.50–1.10)
GFR calc Af Amer: 28 mL/min — ABNORMAL LOW (ref >90)
GFR calc non Af Amer: 24 mL/min — ABNORMAL LOW (ref >90)
GLUCOSE: 138 mg/dL — AB (ref 70–99)
Potassium: 4.9 mEq/L (ref 3.7–5.3)
Sodium: 146 mEq/L (ref 137–147)

## 2013-10-13 LAB — GLUCOSE, CAPILLARY
GLUCOSE-CAPILLARY: 134 mg/dL — AB (ref 70–99)
GLUCOSE-CAPILLARY: 139 mg/dL — AB (ref 70–99)
Glucose-Capillary: 125 mg/dL — ABNORMAL HIGH (ref 70–99)
Glucose-Capillary: 123 mg/dL — ABNORMAL HIGH (ref 70–99)
Glucose-Capillary: 106 mg/dL — ABNORMAL HIGH (ref 70–99)

## 2013-10-13 LAB — CBC
HCT: 23.7 % — ABNORMAL LOW (ref 36.0–46.0)
Hemoglobin: 7.7 g/dL — ABNORMAL LOW (ref 12.0–15.0)
MCH: 29.6 pg (ref 26.0–34.0)
MCHC: 32.5 g/dL (ref 30.0–36.0)
MCV: 91.2 fL (ref 78.0–100.0)
PLATELETS: 199 10*3/uL (ref 150–400)
RBC: 2.6 MIL/uL — ABNORMAL LOW (ref 3.87–5.11)
RDW: 16.8 % — AB (ref 11.5–15.5)
WBC: 8.2 10*3/uL (ref 4.0–10.5)

## 2013-10-13 LAB — VITAMIN B12: VITAMIN B 12: 1457 pg/mL — AB (ref 211–911)

## 2013-10-13 LAB — IRON AND TIBC
Iron: 42 ug/dL (ref 42–135)
SATURATION RATIOS: 19 % — AB (ref 20–55)
TIBC: 220 ug/dL — ABNORMAL LOW (ref 250–470)
UIBC: 178 ug/dL (ref 125–400)

## 2013-10-13 LAB — FOLATE: Folate: 12 ng/mL

## 2013-10-13 LAB — FERRITIN: Ferritin: 92 ng/mL (ref 10–291)

## 2013-10-13 MED ORDER — INSULIN ASPART 100 UNIT/ML ~~LOC~~ SOLN
0.0000 [IU] | SUBCUTANEOUS | Status: DC
  Administered 2013-10-13 – 2013-10-15 (×5): 2 [IU] via SUBCUTANEOUS
  Administered 2013-10-15: 5 [IU] via SUBCUTANEOUS
  Administered 2013-10-15 (×2): 2 [IU] via SUBCUTANEOUS
  Administered 2013-10-16: 3 [IU] via SUBCUTANEOUS
  Administered 2013-10-16 (×5): 2 [IU] via SUBCUTANEOUS
  Administered 2013-10-17: 23:00:00 via SUBCUTANEOUS
  Administered 2013-10-17 (×3): 3 [IU] via SUBCUTANEOUS
  Administered 2013-10-18: 2 [IU] via SUBCUTANEOUS
  Administered 2013-10-18 (×2): 3 [IU] via SUBCUTANEOUS
  Administered 2013-10-18: 2 [IU] via SUBCUTANEOUS
  Administered 2013-10-19: 3 [IU] via SUBCUTANEOUS
  Administered 2013-10-19: 2 [IU] via SUBCUTANEOUS
  Administered 2013-10-19 – 2013-10-20 (×4): 3 [IU] via SUBCUTANEOUS
  Administered 2013-10-20 (×4): 2 [IU] via SUBCUTANEOUS

## 2013-10-13 MED ORDER — SODIUM CHLORIDE 0.9 % IV BOLUS (SEPSIS)
500.0000 mL | Freq: Once | INTRAVENOUS | Status: AC
  Administered 2013-10-13: 500 mL via INTRAVENOUS

## 2013-10-13 MED ORDER — POTASSIUM CHLORIDE IN NACL 40-0.9 MEQ/L-% IV SOLN
INTRAVENOUS | Status: DC
  Administered 2013-10-13: 11:00:00 via INTRAVENOUS
  Administered 2013-10-14: 20 mL/h via INTRAVENOUS
  Administered 2013-10-14: 75 mL/h via INTRAVENOUS
  Filled 2013-10-13 (×4): qty 1000

## 2013-10-13 NOTE — Progress Notes (Signed)
Stroke Team Progress Note  HISTORY 73 y.o. female who went to bed normal last evening. Was found on the floor by her daughter yesterday. Patient is unclear about the details of what happened. Patient was initially taken to [facility] where the patient reported that she thought she got dizzy and fell the day before. Was initially evaluated as being able to move all extremities but speech was altered and patient was having trouble getting her words out. While being further evaluated in the ED the patient was noted to have right sided weakness. SOC was contacted and it was felt that the patient would benefit from the additional services available to her at a Stroke Center. [facility] at that time. Initial NIHSS on presentation to [facility] of 18. [facility] patient also noted to have an elevated troponin. She was given a bolus of Heparin. Also noted to be in atrial fibrillation. Not TPA candidate as outside TPA window as well as outside of interventional window.   SUBJECTIVE Her daughter is at the bedside. Patient has no medical history because she has never been to the doctor.    OBJECTIVE Most recent Vital Signs: Filed Vitals:   10/13/13 0500 10/13/13 0600 10/13/13 0700 10/13/13 0800  BP: 147/53 165/76 155/67 171/69  Pulse: 87 82 88 85  Temp:      TempSrc:      Resp: 19 28 27 30   Height:      Weight:      SpO2: 96% 96% 96% 98%   CBG (last 3)   Recent Labs  10/12/13 2356 10/13/13 0400 10/13/13 0810  GLUCAP 139* 125* 123*    IV Fluid Intake:   . dextrose 5 % and 0.9 % NaCl with KCl 40 mEq/L 50 mL/hr at 10/13/13 0800   MEDICATIONS  . antiseptic oral rinse  15 mL Mouth Rinse BID  . aspirin  300 mg Rectal Daily  . famotidine (PEPCID) IV  20 mg Intravenous Q24H  . heparin  5,000 Units Subcutaneous 3 times per day  . insulin aspart  1-3 Units Subcutaneous 6 times per day  . metoprolol  5 mg Intravenous 4 times per day   PRN:  hydrALAZINE,  metoprolol, senna-docusate  Diet:  NPO  Activity:   DVT Prophylaxis:  Heparin 5000 units sq tid, SCDs   CLINICALLY SIGNIFICANT STUDIES Basic Metabolic Panel:   Recent Labs Lab 10/12/13 0500 10/13/13 0330  NA 149* 146  K 4.2 4.9  CL 108 111  CO2 29 23  GLUCOSE 114* 138*  BUN 38* 42*  CREATININE 1.41* 1.99*  CALCIUM 8.8 9.1   Liver Function Tests:   Recent Labs Lab 10/11/13 1857  AST 53*  ALT 20  ALKPHOS 68  BILITOT 0.4  PROT 6.1  ALBUMIN 3.2*   CBC:   Recent Labs Lab 10/11/13 1857 10/13/13 0330  WBC 8.9 8.2  NEUTROABS 7.1  --   HGB 9.6* 7.7*  HCT 28.1* 23.7*  MCV 87.3 91.2  PLT 198 199   Coagulation:   Recent Labs Lab 10/11/13 1857  LABPROT 13.9  INR 1.09   Cardiac Enzymes:   Recent Labs Lab 10/11/13 1857 10/11/13 2206 10/12/13 0327 10/12/13 1450  CKTOTAL 710*  --   --   --   CKMB 18.8*  --   --   --   TROPONINI 4.15* 4.85* 4.45* 2.36*   Urinalysis: No results found for this basename: COLORURINE, APPERANCEUR, LABSPEC, PHURINE, GLUCOSEU, HGBUR, BILIRUBINUR, KETONESUR, PROTEINUR,  UROBILINOGEN, NITRITE, LEUKOCYTESUR,  in the last 168 hours Lipid Panel    Component Value Date/Time   CHOL 177 10/12/2013 0500   TRIG 121 10/12/2013 0500   HDL 60 10/12/2013 0500   CHOLHDL 3.0 10/12/2013 0500   VLDL 24 10/12/2013 0500   LDLCALC 93 10/12/2013 0500   HgbA1C  Lab Results  Component Value Date   HGBA1C 5.6 10/12/2013    Urine Drug Screen:   No results found for this basename: labopia,  cocainscrnur,  labbenz,  amphetmu,  thcu,  labbarb    Alcohol Level: No results found for this basename: ETH,  in the last 168 hours  CT of the brain  10/12/2013   1. No acute intracranial pathology seen on CT. 2. Diffuse small vessel ischemic microangiopathy again noted; scattered chronic lacunar infarcts within the basal ganglia bilaterally.     CT Angio Head 10/11/2013   1. No acute or subacute cortical infarct is evident. 2. Bilateral basal ganglia infarcts are age  indeterminate. 3. 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 4. Segmental stenoses within the PCA branch vessels bilaterally and right greater than left MCA branch vessels. 5. Segmental stenoses within the pericallosal arteries, right greater than left.     MRI of the brain  10/12/2013    1. Acute nonhemorrhagic infarct involving the posterior left lentiform nucleus extending superiorly into the corona radiata. 2. Additional remote lacunar infarcts are present in the basal ganglia, brainstem, inferior cerebellum bilaterally. 3. Atrophy and diffuse white matter changes are present bilaterally including the area of acute infarct. This likely reflects the sequelae of chronic microvascular ischemia. 4. Minimal sinus disease.   2D Echocardiogram    Carotid Doppler  Findings suggest 60-79% right internal carotid artery stenosis, and 1-39% left internal carotid artery stenosis. Vertebral arteries are patent with antegrade flow.  CXR  10/11/2013    No acute cardiopulmonary disease.     EKG  A-fib with RVR and now in sinus rhythm.    Therapy Recommendations   Physical Exam   Neurologic Examination:  Mental Status:  Alert, oriented, thought content appropriate. Speech fluent without evidence of aphasia but slurred. Able to follow simple commands without difficulty.  Cranial Nerves:  II: Discs flat bilaterally; Visual fields grossly normal, pupils equal, round, reactive to light and accommodation  III,IV, VI: ptosis not present, patient unable to go beyond midline to the right  V,VII: right facial droop, facial light touch sensation decreased on the right  VIII: hearing normal bilaterally  IX,X: gag reflex reduced  XI: shoulder shrug decreased on the right  XII: midline tongue extension  Motor:  Right : Upper extremity 0/5 Left: Upper extremity 5/5  Lower extremity 0/5 Lower extremity 5/5  Tone and bulk:normal tone throughout; no atrophy noted  Sensory: Pinprick and  light touch decreased on the right  Deep Tendon Reflexes: 2+ throughout with an absent left ankle jerk  Plantars:  Right: upgoing Left: mute  Cerebellar:  normal finger-to-nose and normal heel-to-shin testing on the left. Unable to perform on the right  Gait: Unable to test due to weakness  CV: pulses palpable throughout    ASSESSMENT Ms. Wendy Malone is a 73 y.o. female presenting with slurred speech, left eye preference and right hemiparesis as well as R sided neglect.  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 Current infarcts felt to be embolic secondary  to atrial fibrillation. On no antithrombotics prior to admission. Now on aspirin suppository 300 mg daily for secondary stroke prevention. Patient with resultant write him appearances, right-sided neurologic neglect, dysarthria, left gaze preference, dysphagia. Work up underway.   A- fib, converted to sinus, off amio now.   Elevated troponin, trended down to 4 from initial   6. Cardiology consulted. Given severity of stroke, will hold on further evaluation at this point. Will plan nuclear study when she recovers from stroke for risk stratification.  Cigarette smoker  Right ICA stenosis 60-79%, incidental finding  Anemia  Acute kidney failure, creatinine 1.99  Hospital day # 2  TREATMENT/PLAN  Okay to transfer to the floor from stroke standpoint  Continue ASA suppository for secondary stroke prevention. Agree with cardiology recommendations for a NOAC. At this time, given risk for hemorrhagic transformation, as well as inability to swallow, will hold and follow.  500 mL normal saline bolus followed by normal saline with 40 Kcl at 75 mL/hr. Dextrose is contraindicated in the setting of acute stroke.  Speech eval to check swallow  Therapy evals PT/OT - likely rehab consult  DNR/DNI  Dr.  Pearlean Brownie discussed diagnosis, prognosis,  treatment options and plan of care with daughter and Dr. Herma Carson.    Annie Main, MSN, RN, ANVP-BC, ANP-BC, [facility] Pager: [phone] 10/13/2013 10:15 AM  I have personally obtained a history, examined the patient, evaluated imaging results, and formulated the assessment and plan of care. I agree with the above.  Delia Heady, MD

## 2013-10-13 NOTE — Progress Notes (Signed)
Rehab Admissions Coordinator Note:  Patient was screened by Trish MageLogue, Maxie Debose M for appropriateness for an Inpatient Acute Rehab Consult.  At this time, we are recommending Inpatient Rehab consult.  Trish MageLogue, Mialynn Shelvin M 10/13/2013, 3:31 PM  I can be reached at [phone].

## 2013-10-13 NOTE — Progress Notes (Signed)
    Subjective:  Denies CP or dyspnea; expressive difficulties   Objective:  Filed Vitals:   10/13/13 0800 10/13/13 0900 10/13/13 1000 10/13/13 1100  BP: 171/69 176/64 176/54 150/53  Pulse: 85 88 88 88  Temp: 98.2 F (36.8 C)     TempSrc: Oral     Resp: 30 17 17 20   Height:      Weight:      SpO2: 98% 97% 97% 97%    Intake/Output from previous day:  Intake/Output Summary (Last 24 hours) at 10/13/13 1227 Last data filed at 10/13/13 1000  Gross per 24 hour  Intake   1000 ml  Output    465 ml  Net    535 ml    Physical Exam: Physical exam: Well-developed well-nourished in no acute distress.  Skin is warm and dry.  HEENT is normal.  Neck is supple. No thyromegaly.  Chest rhonchi Cardiovascular exam is regular rate and rhythm.  Abdominal exam nontender or distended. No masses palpated. Extremities show no edema. neuro right facial droop; right hemparesis    Lab Results: Basic Metabolic Panel:  Recent Labs  16/05/9602/01/15 0500 10/13/13 0330  NA 149* 146  K 4.2 4.9  CL 108 111  CO2 29 23  GLUCOSE 114* 138*  BUN 38* 42*  CREATININE 1.41* 1.99*  CALCIUM 8.8 9.1   CBC:  Recent Labs  10/11/13 1857 10/13/13 0330  WBC 8.9 8.2  NEUTROABS 7.1  --   HGB 9.6* 7.7*  HCT 28.1* 23.7*  MCV 87.3 91.2  PLT 198 199   Cardiac Enzymes:  Recent Labs  10/11/13 1857 10/11/13 2206 10/12/13 0327 10/12/13 1450  CKTOTAL 710*  --   --   --   CKMB 18.8*  --   --   --   TROPONINI 4.15* 4.85* 4.45* 2.36*     Assessment/Plan:  1. Atrial fibrillation: Paroxysmal. In sinus at present. Continue lopressor. She will need to be anticoagulated, likely by NOAC, when cleared by neuro (holding off for right now given large CVA and concern for hemorrhagic conversion). She is also anemic and should have workup for this while she is here prior to starting anticoagulation: stool guaiac, Fe studies, B12, etc.  2. Elevated troponin: No chest pain. Etiology uncertain. Possibly related to  CVA. Given severity of CVA, would hold on further evaluation at this point. LV function normal on echo. Would plan nuclear study when she recovers from CVA for risk stratification.  3. Acute kidney failure: possibly related to NPO status and dehydration; follow. 4. Anemia: needs evaluation prior to anticoagulation; WU per primary service.   Olga MillersBrian Crenshaw 10/13/2013, 12:27 PM

## 2013-10-13 NOTE — Evaluation (Signed)
Physical Therapy Evaluation Patient Details Name: Wendy Malone MRN: [id number] DOB: May 21, 1941 Today's Date: 10/13/2013 Time: [id number] PT Time Calculation (min): 32 min  PT Assessment / Plan / Recommendation History of Present Illness  73 yo with HTN presenting 2/28 with right hemiparesis and facial droop PMHx macular degeneration (Lt eye worse than Rt per daughter)  Clinical Impression  Pt admitted with Rt sided weakness. Currently she is flaccid in both her UE and LE and lethargic with poor ability to participate. Will continue to follow and see if alertness improves. Pt currently with functional limitations due to the deficits listed below (see PT Problem List).  Pt will benefit from skilled PT to increase their independence and safety with mobility to allow discharge to the venue listed below.       PT Assessment  Patient needs continued PT services    Follow Up Recommendations  CIR    Does the patient have the potential to tolerate intense rehabilitation      Barriers to Discharge Decreased caregiver support      Equipment Recommendations   (TBA)    Recommendations for Other Services OT consult   Frequency Min 3X/week    Precautions / Restrictions Precautions Precautions: Fall;Other (comment) (limited vision/macular degeneration) Restrictions Weight Bearing Restrictions: No   Pertinent Vitals/Pain SaO2.97% throughout session; sounded wet with poor cough      Mobility  Bed Mobility Overal bed mobility: Needs Assistance;+2 for physical assistance General bed mobility comments: pt leans to her left in sitting (HOB elevated) and needs +2 assist to right her trunk to midline Modified Rankin (Stroke Patients Only) Pre-Morbid Rankin Score: No symptoms Modified Rankin: Severe disability    Exercises Other Exercises Other Exercises: PROM to Rt extremities; AROM Lt extremities   PT Diagnosis: Hemiplegia dominant side  PT Problem List: Decreased strength;Decreased activity  tolerance;Decreased balance;Decreased mobility;Decreased knowledge of use of DME;Decreased knowledge of precautions;Cardiopulmonary status limiting activity;Impaired sensation;Impaired tone PT Treatment Interventions: DME instruction;Functional mobility training;Therapeutic activities;Balance training;Neuromuscular re-education;Patient/family education     PT Goals(Current goals can be found in the care plan section) Acute Rehab PT Goals Patient Stated Goal: uhable to state PT Goal Formulation: With family Time For Goal Achievement: 10/27/13 Potential to Achieve Goals: Fair  Visit Information  Last PT Received On: 10/13/13 Assistance Needed: +2 History of Present Illness: 73 yo with HTN presenting 2/28 with right hemiparesis and facial droop PMHx macular degeneration (Lt eye worse than Rt per daughter)       Prior Functioning  Home Living Family/patient expects to be discharged to:: Inpatient rehab Prior Function Level of Independence: Independent Communication Communication: Expressive difficulties;Receptive difficulties    Cognition  Cognition Arousal/Alertness: Lethargic Behavior During Therapy: Flat affect Overall Cognitive Status: Difficult to assess Difficult to assess due to: Impaired communication    Extremity/Trunk Assessment Upper Extremity Assessment Upper Extremity Assessment: RUE deficits/detail RUE Deficits / Details: flaccid, no shoulder shrug elicited Lower Extremity Assessment Lower Extremity Assessment: RLE deficits/detail RLE Deficits / Details: flaccid; ? sensation Rt toes when PROM performed pt appeared to grimace (unable to reproduce) Cervical / Trunk Assessment Cervical / Trunk Assessment: Other exceptions Cervical / Trunk Exceptions: HOB fully elevated due to poor secretion management and pt leans to her Lt   Balance Balance Overall balance assessment: Needs assistance General Comments General comments (skin integrity, edema, etc.): +edema RUE; pt  able to turn her head past midline to locate therapist on her Rt, however her eyes never moved past midline  End of Session PT - End of Session Activity Tolerance: Patient limited by lethargy Patient left: in bed;with call bell/phone within reach;with family/visitor present  GP     Adiva Boettner 10/13/2013, 2:13 PM Pager [phone]

## 2013-10-13 NOTE — Evaluation (Signed)
Clinical/Bedside Swallow Evaluation Patient Details  Name: Wendy Malone MRN: [id number] Date of Birth: 1940/08/20  Today's Date: 10/13/2013 Time: 1135-1150 SLP Time Calculation (min): 15 min  Past Medical History:  Past Medical History  Diagnosis Date  . Hypertension     since December 2014  . GERD (gastroesophageal reflux disease)   . Macular degeneration    Past Surgical History: History reviewed. No pertinent past surgical history. HPI:  Wendy Malone is a 73 y.o. female with HTN and GERD who [facility] with acute onset slurred speech, right sided hemiparesis and facial droop, found to have acute left brain CVA. MRI brain shows Acute nonhemorrhagic infarct involving the posterior left lentiform nucleus extending superiorly into the corona radiata. . Additional remote lacunar infarcts are present in the basal ganglia, brainstem, inferior cerebellum bilaterally.Initial NIHSS on presentation to [facility] of 18.    Assessment / Plan / Recommendation Clinical Impression  Patient presents with a severe sensory motor based oropharyngeal dysphagia with CN deficits impacting airway protection. Severe wet vocal quality noted at baseline indicative of aspiration of secretions despite max clinician cueing for attempts to clear. Cued cough weak and non-functional. Oral transit of minimal pos provided and intra oral pressure poor for initiation of swallow. Education complete with patient and daugther regarding rationale for continued NPO status and need for thorough oral care. SLP will f/u at bedside for readiness for pos or objective eval as evidenced by improved secretions management.     Aspiration Risk  Severe    Diet Recommendation NPO;Alternative means - temporary   Medication Administration: Via alternative means    Other  Recommendations Oral Care Recommendations: Oral care Q4 per protocol   Follow Up Recommendations  Inpatient Rehab    Frequency and Duration min 3x week  2  weeks   Pertinent Vitals/Pain n/a        Swallow Study    General HPI: Wendy Malone is a 73 y.o. female with HTN and GERD who [facility] with acute onset slurred speech, right sided hemiparesis and facial droop, found to have acute left brain CVA. MRI brain shows Acute nonhemorrhagic infarct involving the posterior left lentiform nucleus extending superiorly into the corona radiata. . Additional remote lacunar infarcts are present in the basal ganglia, brainstem, inferior cerebellum bilaterally.Initial NIHSS on presentation to [facility] of 18.  Type of Study: Bedside swallow evaluation Previous Swallow Assessment: none Diet Prior to this Study: NPO Temperature Spikes Noted: No Respiratory Status: Nasal cannula History of Recent Intubation: No Behavior/Cognition: Alert;Cooperative;Pleasant mood (aphasic, dysarthric) Oral Cavity - Dentition: Poor condition;Missing dentition Self-Feeding Abilities: Able to feed self;Needs assist Patient Positioning: Upright in bed Baseline Vocal Quality: Wet;Low vocal intensity Volitional Cough: Weak (severe) Volitional Swallow: Able to elicit (with max cues)    Oral/Motor/Sensory Function Overall Oral Motor/Sensory Function: Impaired Labial ROM: Reduced right Labial Symmetry: Abnormal symmetry right Labial Strength: Reduced Labial Sensation: Reduced Lingual ROM: Reduced right Lingual Symmetry: Abnormal symmetry right Lingual Strength: Reduced Lingual Sensation: Reduced Facial ROM: Reduced right Facial Symmetry: Right droop Facial Strength: Reduced Facial Sensation: Reduced Velum: Within Functional Limits Mandible: Within Functional Limits   Ice Chips Ice chips: Impaired Presentation: Spoon Oral Phase Impairments: Reduced labial seal;Reduced lingual movement/coordination;Impaired anterior to posterior transit;Impaired mastication Oral Phase Functional Implications: Prolonged oral transit;Oral residue;Oral holding (poor intraoral  pressure) Pharyngeal Phase Impairments: Suspected delayed Swallow;Decreased hyoid-laryngeal movement;Wet Vocal Quality   Thin Liquid Thin Liquid: Not tested    Nectar Thick Nectar Thick Liquid: Not tested  Honey Thick Honey Thick Liquid: Not tested   Puree Puree: Not tested   Solid   GO   Wendy Herd MA, CCC-SLP [phone]  Solid: Not tested       Cosby Malone Wendy 10/13/2013,11:52 AM

## 2013-10-14 ENCOUNTER — Inpatient Hospital Stay (HOSPITAL_COMMUNITY): Payer: Medicare Other

## 2013-10-14 DIAGNOSIS — R0609 Other forms of dyspnea: Secondary | ICD-10-CM

## 2013-10-14 DIAGNOSIS — I639 Cerebral infarction, unspecified: Secondary | ICD-10-CM | POA: Diagnosis present

## 2013-10-14 DIAGNOSIS — D649 Anemia, unspecified: Secondary | ICD-10-CM

## 2013-10-14 DIAGNOSIS — R633 Feeding difficulties, unspecified: Secondary | ICD-10-CM

## 2013-10-14 DIAGNOSIS — R1310 Dysphagia, unspecified: Secondary | ICD-10-CM

## 2013-10-14 DIAGNOSIS — R0989 Other specified symptoms and signs involving the circulatory and respiratory systems: Secondary | ICD-10-CM

## 2013-10-14 DIAGNOSIS — I6330 Cerebral infarction due to thrombosis of unspecified cerebral artery: Secondary | ICD-10-CM

## 2013-10-14 LAB — GLUCOSE, CAPILLARY
Glucose-Capillary: 110 mg/dL — ABNORMAL HIGH (ref 70–99)
Glucose-Capillary: 114 mg/dL — ABNORMAL HIGH (ref 70–99)
Glucose-Capillary: 117 mg/dL — ABNORMAL HIGH (ref 70–99)
Glucose-Capillary: 121 mg/dL — ABNORMAL HIGH (ref 70–99)
Glucose-Capillary: 130 mg/dL — ABNORMAL HIGH (ref 70–99)
Glucose-Capillary: 134 mg/dL — ABNORMAL HIGH (ref 70–99)

## 2013-10-14 LAB — PRO B NATRIURETIC PEPTIDE: Pro B Natriuretic peptide (BNP): 29115 pg/mL — ABNORMAL HIGH (ref 0–125)

## 2013-10-14 MED ORDER — DILTIAZEM HCL 100 MG IV SOLR
5.0000 mg/h | INTRAVENOUS | Status: DC
  Administered 2013-10-14: 10 mg/h via INTRAVENOUS
  Administered 2013-10-15: 5 mg/h via INTRAVENOUS
  Administered 2013-10-15 – 2013-10-17 (×4): 10 mg/h via INTRAVENOUS
  Filled 2013-10-14 (×7): qty 100

## 2013-10-14 MED ORDER — FUROSEMIDE 10 MG/ML IJ SOLN
40.0000 mg | Freq: Two times a day (BID) | INTRAMUSCULAR | Status: DC
  Administered 2013-10-14 – 2013-10-15 (×2): 40 mg via INTRAVENOUS
  Filled 2013-10-14 (×4): qty 4

## 2013-10-14 MED ORDER — CLONIDINE HCL 0.2 MG/24HR TD PTWK
0.2000 mg | MEDICATED_PATCH | TRANSDERMAL | Status: DC
  Administered 2013-10-14 – 2013-10-21 (×2): 0.2 mg via TRANSDERMAL
  Filled 2013-10-14 (×2): qty 1

## 2013-10-14 MED ORDER — FUROSEMIDE 10 MG/ML IJ SOLN
40.0000 mg | Freq: Once | INTRAMUSCULAR | Status: AC
  Administered 2013-10-14: 40 mg via INTRAVENOUS
  Filled 2013-10-14: qty 4

## 2013-10-14 MED ORDER — LEVALBUTEROL HCL 0.63 MG/3ML IN NEBU
INHALATION_SOLUTION | RESPIRATORY_TRACT | Status: AC
  Filled 2013-10-14: qty 3

## 2013-10-14 MED ORDER — IPRATROPIUM BROMIDE 0.02 % IN SOLN
0.5000 mg | Freq: Four times a day (QID) | RESPIRATORY_TRACT | Status: DC
  Administered 2013-10-14 – 2013-10-15 (×3): 0.5 mg via RESPIRATORY_TRACT
  Filled 2013-10-14 (×4): qty 2.5

## 2013-10-14 MED ORDER — METHYLPREDNISOLONE SODIUM SUCC 125 MG IJ SOLR
60.0000 mg | Freq: Four times a day (QID) | INTRAMUSCULAR | Status: DC
  Administered 2013-10-14 – 2013-10-17 (×12): 60 mg via INTRAVENOUS
  Filled 2013-10-14 (×10): qty 0.96
  Filled 2013-10-14: qty 2
  Filled 2013-10-14 (×6): qty 0.96

## 2013-10-14 MED ORDER — LABETALOL HCL 5 MG/ML IV SOLN: 20.0000 mg | Freq: Once | INTRAVENOUS | Status: DC

## 2013-10-14 MED ORDER — LEVALBUTEROL HCL 0.63 MG/3ML IN NEBU
0.6300 mg | INHALATION_SOLUTION | RESPIRATORY_TRACT | Status: DC | PRN
  Administered 2013-10-14 (×2): 0.63 mg via RESPIRATORY_TRACT
  Filled 2013-10-14 (×2): qty 3

## 2013-10-14 MED ORDER — BUDESONIDE 0.25 MG/2ML IN SUSP
0.2500 mg | Freq: Two times a day (BID) | RESPIRATORY_TRACT | Status: DC
  Administered 2013-10-15: 0.25 mg via RESPIRATORY_TRACT
  Filled 2013-10-14 (×5): qty 2

## 2013-10-14 MED ORDER — HYDRALAZINE HCL 20 MG/ML IJ SOLN
10.0000 mg | Freq: Four times a day (QID) | INTRAMUSCULAR | Status: DC | PRN
  Administered 2013-10-14 – 2013-10-20 (×6): 10 mg via INTRAVENOUS
  Filled 2013-10-14 (×6): qty 1

## 2013-10-14 MED ORDER — LEVALBUTEROL HCL 1.25 MG/0.5ML IN NEBU
1.2500 mg | INHALATION_SOLUTION | Freq: Four times a day (QID) | RESPIRATORY_TRACT | Status: DC
  Administered 2013-10-14 – 2013-10-15 (×3): 1.25 mg via RESPIRATORY_TRACT
  Filled 2013-10-14 (×9): qty 0.5

## 2013-10-14 MED ORDER — LABETALOL HCL 5 MG/ML IV SOLN
10.0000 mg | INTRAVENOUS | Status: DC | PRN
Start: 2013-10-14 — End: 2013-10-22
  Administered 2013-10-14 (×3): 10 mg via INTRAVENOUS
  Filled 2013-10-14 (×2): qty 4

## 2013-10-14 NOTE — Progress Notes (Signed)
Speech Language Pathology Treatment:    Patient Details Name: Wendy Malone MRN: [id number] DOB: Jan 11, 1941 Today's Date: 10/14/2013 Time: [id number] SLP Time Calculation (min): 38 min  Assessment / Plan / Recommendation Clinical Impression  Patient exhibits an oral apraxia which currently inhibits her ability to move purees or thin liquids posteriorly in oral cavity to trigger the swallow.  Pt. Holds each bolus in anterior portion of mouth and is unable to move tongue to propel bolus posteriorly.  Blowing type respirations noted on right side of mouth due to decreased labial strength and closure.  Daughter at bedside, educated to probable need for temporary alternative feeding and options.   HPI HPI: Wendy Malone is a 73 y.o. female with HTN and GERD who [facility] with acute onset slurred speech, right sided hemiparesis and facial droop, found to have acute left brain CVA. MRI brain shows Acute nonhemorrhagic infarct involving the posterior left lentiform nucleus extending superiorly into the corona radiata. . Additional remote lacunar infarcts are present in the basal ganglia, brainstem, inferior cerebellum bilaterally.Initial NIHSS on presentation to [facility] of 18.    Pertinent Vitals CXR:  Mild patchy airspace opacity in lung bases; low grade fever;  Expiratory wheeze noted   SLP Plan  Continue with current plan of care    Recommendations NPO; Meds via Alternate means               Oral Care Recommendations: Oral care Q4 per protocol Follow up Recommendations: Inpatient Rehab Plan: Continue with current plan of care    GO     Maryjo RochesterWillis, Aleda Madl T 10/14/2013, 11:29 AM

## 2013-10-14 NOTE — Progress Notes (Signed)
Patient went in to AFIB/RVR.  Notified Corine ShelterLuke Kilroy, [place] he entered order for IV diiltiazem

## 2013-10-14 NOTE — Consult Note (Signed)
Physical Medicine and Rehabilitation Consult Reason for Consult: CVA Referring Physician: [facility]   HPI: Wendy Malone is a 73 y.o. right-handed female with history of hypertension. Admitted 10/11/2013 with right-sided weakness, slurred speech and facial droop. 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 Echocardiogram with ejection fraction of 65% grade 1 diastolic dysfunction. Carotid Dopplers with 60-79% right ICA stenosis. Patient did not receive TPA. Patient with intermittent atrial fibrillation/RVR upon admission with positive troponin. Cardiology services consulted. Patient currently maintained on aspirin therapy for CVA prophylaxis as well as subcutaneous heparin for DVT prophylaxis. Awake plan for possible Coumadin therapy. Close monitoring of renal function with latest creatinine 1.99. Followup labs response 10/14/2013 heart rate 150s and did receive intravenous labetalol. Patient is currently n.p.o. with planned followup per speech therapy. Physical therapy evaluation completed 10/13/2013 at the recommendations of physical medicine rehabilitation consult.   Review of Systems  Gastrointestinal:       GERD  All other systems reviewed and are negative.   Past Medical History  Diagnosis Date  . Hypertension     since December 2014  . GERD (gastroesophageal reflux disease)   . Macular degeneration    History reviewed. No pertinent past surgical history. History reviewed. No pertinent family history. Social History:  reports that she quit smoking about 2 months ago. She does not have any smokeless tobacco history on file. She reports that she does not drink alcohol or use illicit drugs. Allergies: No Known Allergies Medications Prior to Admission  Medication Sig Dispense  Refill  . aspirin 81 MG tablet Take 81 mg by mouth daily.      . metoprolol tartrate (LOPRESSOR) 25 MG tablet Take 25 mg by mouth 2 (two) times daily.      . ranitidine (ZANTAC) 150 MG tablet Take 150 mg by mouth daily.        Home: Home Living Family/patient expects to be discharged to:: Inpatient rehab  Functional History:   Functional Status:  Mobility:          ADL:    Cognition: Cognition Overall Cognitive Status: Difficult to assess Orientation Level: Other (comment) (uta d/t expressive aphagia) Cognition Arousal/Alertness: Lethargic Behavior During Therapy: Flat affect Overall Cognitive Status: Difficult to assess Difficult to assess due to: Impaired communication  Blood pressure 188/82, pulse 88, temperature 99.1 F (37.3 C), temperature source Oral, resp. rate 22, height 5' (1.524 m), weight 63.4 kg (139 lb 12.4 oz), SpO2 99.00%. Physical Exam  HENT:  Head: Normocephalic and atraumatic.  Right facial droop  Eyes: Conjunctivae and EOM are normal. Pupils are equal, round, and reactive to light.  Pupils round and reactive to light  Neck: Normal range of motion. Neck supple. No tracheal deviation present. No thyromegaly present.  Cardiovascular:  Cardiac rate controlled  Respiratory: Effort normal and breath sounds normal. No respiratory distress.  GI: Soft. Bowel sounds are normal. She exhibits no distension.  Neurological: She is alert.  Patient makes good eye contact with examiner. Speech is very dysarthric. Sometimes coughed and seemed to have trouble handling secretions. She was able to state her name. She did follow simple commands. Distracted. Left gaze preference. RUE and RLE without volitional movement. Did sense pain. Told me she was in the hospital.   Skin: Skin is warm and dry.  Psychiatric:  restless  Results for orders placed during the hospital encounter of 10/11/13 (from the past 24 hour(s))  GLUCOSE, CAPILLARY     Status: Abnormal    Collection Time    10/13/13  8:10 AM      Result Value Ref Range   Glucose-Capillary 123 (*) 70 - 99 mg/dL  GLUCOSE, CAPILLARY     Status: Abnormal   Collection Time    10/13/13  4:08 PM      Result Value Ref Range   Glucose-Capillary 134 (*) 70 - 99 mg/dL  GLUCOSE, CAPILLARY     Status: Abnormal   Collection Time    10/13/13  7:42 PM      Result Value Ref Range   Glucose-Capillary 106 (*) 70 - 99 mg/dL  GLUCOSE, CAPILLARY     Status: Abnormal   Collection Time    10/14/13 12:31 AM      Result Value Ref Range   Glucose-Capillary 114 (*) 70 - 99 mg/dL  GLUCOSE, CAPILLARY     Status: Abnormal   Collection Time    10/14/13  4:05 AM      Result Value Ref Range   Glucose-Capillary 110 (*) 70 - 99 mg/dL   Mr Brain Wo Contrast  10/12/2013   CLINICAL DATA:  Slurred speech. Ischemic stroke. Right hemiparesis and facial droop.  EXAM: MRI HEAD WITHOUT CONTRAST  TECHNIQUE: Multiplanar, multiecho pulse sequences of the brain and surrounding structures were obtained without intravenous contrast.  COMPARISON:  CT head without contrast 10/12/2013.  FINDINGS: Diffusion-weighted images demonstrate an acute nonhemorrhagic infarct involving the posterior left lentiform nucleus extending into the body of caudate and coronal radiata. The area measures 3.2 x 2.5 x 1.4 cm. The area is present within the region of diffuse white matter disease. Advanced atrophy and extensive white matter changes are present bilaterally. Remote lacunar infarcts are present in the brainstem and inferior cerebellum bilaterally. Remote lacunar infarcts are present in the basal ganglia bilaterally as well.  Flow is present in the major intracranial arteries. The globes and orbits are intact. Mild mucosal thickening is present in the sphenoid sinuses bilaterally. There is minimal mucosal thickening in the ethmoid air cells and inferior left frontal sinus. The mastoid air cells are clear.  IMPRESSION: 1. Acute nonhemorrhagic infarct  involving the posterior left lentiform nucleus extending superiorly into the corona radiata. 2. Additional remote lacunar infarcts are present in the basal ganglia, brainstem, inferior cerebellum bilaterally. 3. Atrophy and diffuse white matter changes are present bilaterally including the area of acute infarct. This likely reflects the sequelae of chronic microvascular ischemia. 4. Minimal sinus disease. These results were called by telephone at the time of interpretation on 10/12/2013 at 11:55 AM to Dr. Loretha Brasil, who verbally acknowledged these results.   Electronically Signed   By: Gennette Pac M.D.   On: 10/12/2013 11:58   Dg Chest Port 1 View  10/14/2013   CLINICAL DATA:  Respiratory distress.  EXAM: PORTABLE CHEST - 1 VIEW  COMPARISON:  There prior  FINDINGS: Cardiac silhouette remains mildly enlarged, mediastinal silhouette is nonsuspicious, calcified aortic knob. Diffuse interstitial prominence increased from prior examination with patchy bibasilar airspace opacities. Small right pleural effusion. No pneumothorax.  Patient is osteopenic. Left antecubital intravenous catheter in place. Multiple EKG lines overlie the patient and may obscure subtle underlying pathology.  IMPRESSION: Stable cardiomegaly with increasing interstitial prominence suggesting pulmonary edema, mild patchy airspace opacity lung bases may reflect confluent edema, atelectasis, less likely pneumonia with small right pleural effusion.  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 Does the need for close, 24 hr/day medical supervision in concert with the patient's rehab needs make it unreasonable for this patient to be served in a less intensive setting? Yes 2. Co-Morbidities requiring supervision/potential complications: afib 3. Due to bladder management, bowel management, safety,  skin/wound care, disease management, medication administration, pain management and patient education, does the patient require 24 hr/day rehab nursing? Yes 4. Does the patient require coordinated care of a physician, rehab nurse, PT (1-2 hrs/day, 5 days/week), OT (1-2 hrs/day, 5 days/week) and SLP (1-2 hrs/day, 5 days/week) to address physical and functional deficits in the context of the above medical diagnosis(es)? Yes Addressing deficits in the following areas: balance, endurance, locomotion, strength, transferring, bowel/bladder control, bathing, dressing, feeding, grooming, toileting, cognition, speech, swallowing and psychosocial support 5. Can the patient actively participate in an intensive therapy program of at least 3 hrs of therapy per day at least 5 days per week? Yes 6. The potential for patient to make measurable gains while on inpatient rehab is excellent 7. Anticipated functional outcomes upon discharge from inpatient rehab are min assist with PT,  Min to mod assist with OT, supervision  with SLP. 8. Estimated rehab length of stay to reach the above functional goals is: 18-22 days 9. Does the patient have adequate social supports to accommodate these discharge functional goals? Yes 10. Anticipated D/C setting: Home 11. Anticipated post D/C treatments: HH therapy 12. Overall Rehab/Functional Prognosis: excellent  RECOMMENDATIONS: This patient's condition is appropriate for continued rehabilitative care in the following setting: CIR Patient has agreed to participate in recommended program. Yes Note that insurance prior authorization may be required for reimbursement for recommended care.  Comment: Rehab Admissions Coordinator to follow up.  Thanks,  Ranelle Oyster, MD, [place] Dom     10/14/2013

## 2013-10-14 NOTE — Significant Event (Signed)
Rapid Response Event Note  Overview: Time Called: 0036 Arrival Time: 0040 Event Type: Respiratory;Cardiac  Initial Focused Assessment: Called by Charge Nurse, Christine for pt in Afib with RVR 150s , and respiratory distress.  Upon arrival to room pt is  Sitting up slumped to left side, diaphoretic,tachypneic 40's, using accessory muscles, tugging to breath.  on 2l [place] with 02 sats 96% and cardiac monitor rate 150's with atrial fib and RVR.   BP:  199/128  Skin warm and damp to touch.  Temp 99.1 Bilateral  Breath sounds with expiratory wheezing throughout.    Interventions: Repositioned pt in bed, given prn lopressor 5 mg effective in decreasing afib to controlled 90's. Obtained orders from Dr Deterding for  xopenex with improvement in  Wheezing. Now with fine rales in BIlat bases and poor cough effort.   Orders for NTS, performed by Shanda BumpsJessica, RT and oral suctioning by staff for copious tan secretions.  At 0115 GIven apresoline 10 mg IV for BP 220/100  with out significant change in BP.  Obtained orders for PCXR, KVO IV fluids .  Given labetolol 10 mg IV  at 0143  0200 VS:  172/63  96 A fib  22 98% 2l [place]  Hand off report to Bosie ClosJudith, Charity fundraiserN.  Will continue to follow .  Awaiting PCXR   Event Summary: Name of Physician Notified: Dr Deterding at [phone]    at    Outcome: Stayed in room and stabalized  Event End Time: 0135  Jules Schickrivette, Yecenia Dalgleish K

## 2013-10-14 NOTE — Progress Notes (Signed)
    Subjective:  AFIB earlier. Now in NSR.  Increased wheeze, dyspnea    Objective:  Filed Vitals:   10/14/13 0400 10/14/13 0436 10/14/13 0535 10/14/13 0746  BP: 217/71  188/82 213/92  Pulse: 98  88 93  Temp: 99.4 F (37.4 C)     TempSrc:      Resp: 18     Height:      Weight:      SpO2: 99% 99%      Intake/Output from previous day:  Intake/Output Summary (Last 24 hours) at 10/14/13 0839 Last data filed at 10/14/13 0500  Gross per 24 hour  Intake    100 ml  Output    300 ml  Net   -200 ml    Physical Exam: Physical exam: Well-developed well-nourished in no acute distress.  Skin is warm and dry.  HEENT is normal.  Neck is supple. No thyromegaly.  Chest rhonchi, poor air movement Bilat, +wheeze Cardiovascular exam is regular rate and rhythm.  Abdominal exam nontender or distended. No masses palpated. Extremities show no edema. neuro right facial droop; right hemparesis, Aphasia    Lab Results: Basic Metabolic Panel:  Recent Labs  69/62/9501/08/28 0500 10/13/13 0330  NA 149* 146  K 4.2 4.9  CL 108 111  CO2 29 23  GLUCOSE 114* 138*  BUN 38* 42*  CREATININE 1.41* 1.99*  CALCIUM 8.8 9.1   CBC:  Recent Labs  10/11/13 1857 10/13/13 0330  WBC 8.9 8.2  NEUTROABS 7.1  --   HGB 9.6* 7.7*  HCT 28.1* 23.7*  MCV 87.3 91.2  PLT 198 199   Cardiac Enzymes:  Recent Labs  10/11/13 1857 10/11/13 2206 10/12/13 0327 10/12/13 1450  CKTOTAL 710*  --   --   --   CKMB 18.8*  --   --   --   TROPONINI 4.15* 4.85* 4.45* 2.36*     Assessment/Plan:  1. Atrial fibrillation: Paroxysmal. In sinus at present. Continue lopressor IV. She will need to be anticoagulated, likely by NOAC, when cleared by neuro (holding off for right now given large CVA and concern for hemorrhagic conversion). She is also anemic and should have workup for this while she is here prior to starting anticoagulation: stool guaiac, Fe studies, B12, etc.   2. Elevated troponin: No chest pain.  Etiology uncertain. Possibly related to CVA. Given severity of CVA, would hold on further evaluation at this point. LV function normal on echo. Would plan nuclear study when she recovers from CVA for risk stratification.   3. Acute kidney failure: possibly exacerbated by IV lasix however CXR has appearance of pulmonary edema.   4. Anemia: needs evaluation prior to anticoagulation; WU per primary service.  5. Dyspnea - steroids, wheeze.    Anureet Bruington 10/14/2013, 8:39 AM

## 2013-10-14 NOTE — Consult Note (Signed)
Consultation  Referring Provider: [facility] Primary Care Physician:  No primary provider on file. Primary Gastroenterologist: none         Reason for Consultation:  Dysphagia and anemia             HPI:   Wendy Malone is a 73 y.o. female transferred from [facility] 2/28 where she had been taken for altered speech and right sided weakness.  MRI of the brain is c/w acute nonhemorrhagic infarct involving the posterior left  lentiform nucleus extending superiorly into the corona radiata. Additional remote lacunar infarcts are present in the basal ganglia, brainstem, inferior cerebellum bilaterally. Cardiiology following her this admission for newly diagnosed atrial fibrillation and elevated troponins.  Cardiac workup on hold given severity of CVA but both cardiology and neurology would like patient to eventually be anticoagulated. Patient has been found to be anemic so there is concern about starting anticoagulants. Patient has had minimal to no encounters with health care system so no baseline labs available. Hgb on admission was 9.6, it is 7.7 today. Normal MCV. No BMs or evidence of bleeding per RN.   Patient has been evaluated by Speech Therapy. Bedside swallowing evaluation is c/w with severe oropharyngeal dysphagia with high risk of aspiration. SLP has recommended temporary alternative means of medications and diet. Inpatient rehab recommended.   Past Medical History  Diagnosis Date  . Hypertension     since December 2014  . GERD (gastroesophageal reflux disease)   . Macular degeneration     PSH:  Unable to obtain from patient [facility]: unable to obtain from patient  History  Substance Use Topics  . Smoking status: Former Smoker    Quit date: 08/10/2013  . Smokeless tobacco: Not on file  . Alcohol Use: No    Prior to Admission medications   Medication Sig Start Date End Date Taking? Authorizing Provider  aspirin 81 MG tablet Take 81 mg by mouth daily.   Yes  Historical Provider, MD  metoprolol tartrate (LOPRESSOR) 25 MG tablet Take 25 mg by mouth 2 (two) times daily. 09/23/13  Yes Historical Provider, MD  ranitidine (ZANTAC) 150 MG tablet Take 150 mg by mouth daily.   Yes Historical Provider, MD    Current Facility-Administered Medications  Medication Dose Route Frequency Provider Last Rate Last Dose  . 0.9 % NaCl with KCl 40 mEq / L  infusion   Intravenous Continuous Zigmund Gottron, MD 20 mL/hr at 10/14/13 0153 20 mL/hr at 10/14/13 0153  . antiseptic oral rinse (BIOTENE) solution 15 mL  15 mL Mouth Rinse BID Curlene Dolphin, MD   15 mL at 10/14/13 0751  . aspirin suppository 300 mg  300 mg Rectal Daily Thana Farr, MD   300 mg at 10/14/13 1101  . budesonide (PULMICORT) nebulizer solution 0.25 mg  0.25 mg Nebulization BID Ripudeep K Rai, MD      . cloNIDine (CATAPRES - Dosed in mg/24 hr) patch 0.2 mg  0.2 mg Transdermal Weekly Ripudeep K Rai, MD   0.2 mg at 10/14/13 1104  . famotidine (PEPCID) IVPB 20 mg  20 mg Intravenous Q24H Oretha Milch, MD   20 mg at 10/14/13 1610  . furosemide (LASIX) injection 40 mg  40 mg Intravenous Q12H Ripudeep K Rai, MD      . heparin injection 5,000 Units  5,000 Units Subcutaneous 3 times per day Curlene Dolphin, MD   5,000 Units at 10/14/13 1351  . hydrALAZINE (APRESOLINE) injection 10  mg  10 mg Intravenous Q6H PRN Ripudeep Jenna Luo, MD   10 mg at 10/14/13 0919  . insulin aspart (novoLOG) injection 0-15 Units  0-15 Units Subcutaneous 6 times per day Lonia Farber, MD   2 Units at 10/14/13 1302  . levalbuterol (XOPENEX) nebulizer solution 1.25 mg  1.25 mg Nebulization Q6H Ripudeep K Rai, MD       And  . ipratropium (ATROVENT) nebulizer solution 0.5 mg  0.5 mg Nebulization Q6H Ripudeep K Rai, MD      . labetalol (NORMODYNE,TRANDATE) injection 10 mg  10 mg Intravenous Q2H PRN Zigmund Gottron, MD   10 mg at 10/14/13 0749  . labetalol (NORMODYNE,TRANDATE) injection 20 mg  20 mg  Intravenous Once Ripudeep K Rai, MD      . levalbuterol Pauline Aus) nebulizer solution 0.63 mg  0.63 mg Nebulization Q3H PRN Zigmund Gottron, MD   0.63 mg at 10/14/13 0434  . methylPREDNISolone sodium succinate (SOLU-MEDROL) 125 mg/2 mL injection 60 mg  60 mg Intravenous Q6H Ripudeep K Rai, MD   60 mg at 10/14/13 0917  . metoprolol (LOPRESSOR) injection 2.5-5 mg  2.5-5 mg Intravenous Q3H PRN Oretha Milch, MD   5 mg at 10/14/13 0057  . metoprolol (LOPRESSOR) injection 5 mg  5 mg Intravenous 4 times per day Oretha Milch, MD   5 mg at 10/14/13 1305  . senna-docusate (Senokot-S) tablet 1 tablet  1 tablet Oral QHS PRN Curlene Dolphin, MD        Allergies as of 10/11/2013  . (No Known Allergies)   Review of Systems: Unable to obtain from patient   Physical Exam:  Vital signs in last 24 hours: Temp:  [98.3 F (36.8 C)-99.4 F (37.4 C)] 98.8 F (37.1 C) (03/03 1222) Pulse Rate:  [86-155] 91 (03/03 1222) Resp:  [18-42] 18 (03/03 1222) BP: (169-222)/(63-129) 197/69 mmHg (03/03 1222) SpO2:  [92 %-100 %] 100 % (03/03 1222) Last BM Date: 10/12/13 General:   White female in NAD Head:  Normocephalic and atraumatic. Eyes:   No icterus.   Conjunctiva pink. Neck:  Supple; no masses felt Lungs:  Respirations even and unlabored. Chest clear to auscultation  Heart:  Regular rate, occasional irregular beat.  Abdomen:  Soft, nondistended, mild RLQ and mid right abdominal tenderness.  Normal bowel sounds. No appreciable masses.  Msk:  Symmetrical without gross deformities.  Extremities:  Without edema. Neurologic:  Alert, makes eye contact but cannot answer questions. She is non-verbal. Slightly restless (pulling at cords / EKG leads).  Skin:  Intact without significant lesions or rashes. Cervical Nodes:  No significant cervical adenopathy.   LAB RESULTS:  Recent Labs  10/11/13 1857 10/13/13 0330  WBC 8.9 8.2  HGB 9.6* 7.7*  HCT 28.1* 23.7*  PLT 198 199   BMET  Recent Labs   10/11/13 1857 10/12/13 0500 10/13/13 0330  NA 148* 149* 146  K 3.2* 4.2 4.9  CL 98 108 111  CO2 25 29 23   GLUCOSE 155* 114* 138*  BUN 28* 38* 42*  CREATININE 1.19* 1.41* 1.99*  CALCIUM 9.4 8.8 9.1   LFT  Recent Labs  10/11/13 1857  PROT 6.1  ALBUMIN 3.2*  AST 53*  ALT 20  ALKPHOS 68  BILITOT 0.4   PT/INR  Recent Labs  10/11/13 1857  LABPROT 13.9  INR 1.09    STUDIES: Dg Chest Port 1 View  10/14/2013   CLINICAL DATA:  Respiratory distress.  EXAM: PORTABLE CHEST - 1 VIEW  COMPARISON:  There prior  FINDINGS: Cardiac silhouette remains mildly enlarged, mediastinal silhouette is nonsuspicious, calcified aortic knob. Diffuse interstitial prominence increased from prior examination with patchy bibasilar airspace opacities. Small right pleural effusion. No pneumothorax.  Patient is osteopenic. Left antecubital intravenous catheter in place. Multiple EKG lines overlie the patient and may obscure subtle underlying pathology.  IMPRESSION: Stable cardiomegaly with increasing interstitial prominence suggesting pulmonary edema, mild patchy airspace opacity lung bases may reflect confluent edema, atelectasis, less likely pneumonia with small right pleural effusion.   Electronically Signed   By: Awilda Metroourtnay  Bloomer   On: 10/14/2013 05:03   PREVIOUS ENDOSCOPIES:            none   Impression / Plan:   521. [age] year old female with acute CVA. Currently on ASA for secondary stroke prevention. Eventual need for anticoagulation is anticipated. See #2.   2. Normocytic anemia. Iron studies not convincing for iron deficiency. B12 and folate normal. No baseline CBC to compare. Will call daughter to discuss but according to hospitalist's note the patient declined endoscopic workup when mentioned by PCP a couple of months back.    3. Severe oropharyngeal dysphagia based on SLP bedside swallowing study. Patient at high risk for aspiration. She will likely need PEG placement to provide long term route for  nutrition and medications. Again, I will discuss this with daughter.   4. Elevated BUN and creatinine ( acute on chronic?) . No baseline labs to compare.  5. Uncontrolled HTN    Thanks   LOS: 3 days   Willette Clusteraula Guenther  10/14/2013, 2:33 PM  GI ATTENDING  History, labs, x-rays reviewed. Patient seen and examined. Agree with H&P as outlined above. 73 y.o. with incapacitating stroke. Asked to see for dysphagia and anemia.  IMPRESSION: 1. Anemia. Normocytic. Duration unknown. Anemia panel unrevealing. Not obvious iron deficiency. Has renal insuficiency. No obvious history of GI bleed. Occult stools pending. 2. 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 Empiric PPI daily to protect upper GI mucosa 2. Not an appropriate candidate for diagnostic endoscopic procedures 3. Anticoagulate if clinically important. Very very few endoscopic findings in this particular patient (no prior history of GI bleed, no liver disease, on PPI) that would preclude anticoagulation up front. 4. Could feed enterally at this point with dubhoff (or panda) tube. If dysphagia persists, and long term feeding tube desired by primary service and family, then ask IR (radiology) to place gastrostomy tube.  Call for questions. Thanks. Will sign off.  Wilhemina BonitoJohn N. Eda KeysPerry, Jr., M.D. [facility]enterology

## 2013-10-14 NOTE — Progress Notes (Signed)
Occupational Therapy Evaluation Patient Details Name: Wendy Malone MRN: [id number] DOB: 1940/12/08 Today's Date: 10/14/2013 Time: [id number] OT Time Calculation (min): 32 min  OT Assessment / Plan / Recommendation History of present illness 73 yo with HTN presenting 2/28 with right hemiparesis and facial droop PMHx macular degeneration (Lt eye worse than Rt per daughter).  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  Pt also having episodes of A-fib with RVR rates into the 150s during her stay.     Clinical Impression   PTA, pt lived independently alone. Daughter lives beside her. Pt presents with significant deficits listed below and would benefit from CIR to maximize level of functional independence with ADL and mobility. Discussed recommendations with daughter, who is very supportive. Pt will benefit from skilled OT services to facilitate D/C to next venue due to below deficits.    OT Assessment  Patient needs continued OT Services    Follow Up Recommendations  CIR;Supervision/Assistance - 24 hour    Barriers to Discharge      Equipment Recommendations  3 in 1 bedside comode;Tub/shower bench;Wheelchair (measurements OT);Wheelchair cushion (measurements OT);Hospital bed    Recommendations for Other Services Rehab consult  Frequency  Min 3X/week    Precautions / Restrictions Precautions Precautions: Fall;Other (comment) (; apparnet L field cut)   Pertinent Vitals/Pain no apparent distress     ADL  Eating/Feeding: NPO Grooming: Maximal assistance (able to wash face) Where Assessed - Grooming: Supported sitting Upper Body Bathing: Maximal assistance Where Assessed - Upper Body Bathing: Supported sitting Lower Body Bathing: +1 Total assistance Where Assessed - Lower Body  Bathing: Supine, head of bed up Upper Body Dressing: +1 Total assistance Where Assessed - Upper Body Dressing: Supported sitting Lower Body Dressing: +1 Total assistance Where Assessed - Lower Body Dressing: Rolling right and/or left;Supine, head of bed up Toilet Transfer: Other (comment) (foley) Toileting - Clothing Manipulation and Hygiene: +1 Total assistance Transfers/Ambulation Related to ADLs: +2    OT Diagnosis: Generalized weakness;Cognitive deficits;Disturbance of vision;Hemiplegia dominant side;Blindness and low vision;Apraxia;Ataxia  OT Problem List: Decreased strength;Decreased range of motion;Decreased activity tolerance;Impaired balance (sitting and/or standing);Impaired vision/perception;Decreased coordination;Decreased cognition;Decreased safety awareness;Decreased knowledge of use of DME or AE;Decreased knowledge of precautions;Cardiopulmonary status limiting activity;Impaired sensation;Impaired tone;Impaired UE functional use;Increased edema OT Treatment Interventions: Self-care/ADL training;Therapeutic exercise;Neuromuscular education;Energy conservation;DME and/or AE instruction;Therapeutic activities;Cognitive remediation/compensation;Visual/perceptual remediation/compensation;Patient/family education;Balance training   OT Goals(Current goals can be found in the care plan section) Acute Rehab OT Goals Patient Stated Goal: uhable to state.  Daughter would like for her to go to CIR level therapies at d/c from acute hospital setting.  OT Goal Formulation: With family Time For Goal Achievement: 10/28/13 Potential to Achieve Goals: Good  Visit Information  Last OT Received On: 10/14/13 Assistance Needed: +2 History of Present Illness: 73 yo with HTN presenting 2/28 with right hemiparesis and facial droop PMHx macular degeneration (Lt eye worse than Rt per daughter).  MRI of the brain showed acute nonhemorrhagic infarct involving the posterior left lentiform nucleus extending  superiorly into the corona radiata as well as additional remote lacunar infarcts present basal ganglia, brainstem, inferior cerebellum bilaterally.  Pt also having episodes of A-fib with RVR rates into the 150s during her stay.         Prior Functioning     Home Living Family/patient expects to be discharged to:: Inpatient  rehab Prior Function Level of Independence: Independent Communication Communication: Expressive difficulties Dominant Hand: Right         Vision/Perception Vision - History Baseline Vision: Other (comment) Visual History: Macular degeneration Patient Visual Report: Other (comment) (questionalbe diplopia. unable to assess at this time) Vision - Assessment Eye Alignment: Impaired (comment) Vision Assessment: Vision impaired - to be further tested in functional context Additional Comments: unable to assess vision at this time due to lethargy and cognitive status. appearsto demonstrate a dysconjugate gaze Perception Perception: Impaired Inattention/Neglect: Does not attend to right visual field;Does not attend to right side of body;Impaired-to be further tested in functional context Praxis Praxis: Impaired Praxis Impairment Details: Initiation;Motor planning;Perseveration   Cognition  Cognition Arousal/Alertness: Lethargic Behavior During Therapy: Restless;Flat affect Overall Cognitive Status: Impaired/Different from baseline Area of Impairment: Attention;Following commands;Safety/judgement;Awareness;Problem solving Current Attention Level: Focused Memory: Decreased recall of precautions Following Commands: Follows one step commands inconsistently;Follows one step commands with increased time Safety/Judgement: Decreased awareness of safety;Decreased awareness of deficits Awareness: Intellectual (if any at all) Problem Solving: Slow processing;Decreased initiation;Difficulty sequencing;Requires verbal cues;Requires tactile cues General Comments: Pt needs  constant redirection, multiple cues to attend to task at hand.  Assist for initiation of movement.   (apparent apraxia) Difficult to assess due to: Impaired communication    Extremity/Trunk Assessment Upper Extremity Assessment Upper Extremity Assessment: RUE deficits/detail (flaccid. Noactive movement observed. ) RUE Sensation: decreased light touch RUE Coordination: decreased fine motor;decreased gross motor (non functional use at this time) Lower Extremity Assessment Lower Extremity Assessment: RLE deficits/detail;Defer to PT evaluation Cervical / Trunk Assessment Cervical / Trunk Assessment: Other exceptions (poor postural control. Unable to maintainmidline)     Mobility Bed Mobility Overal bed mobility: Needs Assistance;+2 for physical assistance Bed Mobility: Supine to Sit;Sit to Supine Supine to sit: +2 for physical assistance;Max assist Sit to supine: +2 for physical assistance;Max assist General bed mobility comments: Pt moving her left leg and left arm, but not much for the actual assit or initiation of movement to sitting on the left side of the bed.  She did grasp onto therapist during transition with her left hand likely due to fear of falling over.   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 no sense of midline Postural control: Posterior lean;Right lateral lean General Comments General comments (skin integrity, edema, etc.): large bruise R knee   End of  Session OT - End of Session Activity Tolerance: Patient limited by fatigue Patient left: in bed;with call bell/phone within reach;with family/visitor present Nurse Communication: Mobility status;Precautions;Other (comment) (R neglect)  GO     Cashmere Dingley,HILLARY 10/14/2013, 2:06 PM [facility], OTR/L  [phone] 10/14/2013

## 2013-10-14 NOTE — Progress Notes (Signed)
Stroke Team Progress Note  HISTORY 73 y.o. female who went to bed normal last evening. Was found on the floor by her daughter yesterday. Patient is unclear about the details of what happened. Patient was initially taken to [facility] where the patient reported that she thought she got dizzy and fell the day before. Was initially evaluated as being able to move all extremities but speech was altered and patient was having trouble getting her words out. While being further evaluated in the ED the patient was noted to have right sided weakness. SOC was contacted and it was felt that the patient would benefit from the additional services available to her at a Stroke Center. Patient was transferred to [facility] at that time. Initial NIHSS on presentation to [facility] of 18. While being evaluated at [facility] patient also noted to have an elevated troponin. She was given a bolus of Heparin. Also noted to be in atrial fibrillation. Not TPA candidate as outside TPA window as well as outside of interventional window.   SUBJECTIVE Patient moved to the floor yesterday. Had a rough night per daughter, who thinks she was moved out of ICU too fast. Her daughter is at the bedside. Dr. Isidoro Donning completing rounds.  OBJECTIVE Most recent Vital Signs: Filed Vitals:   10/14/13 0436 10/14/13 0535 10/14/13 0746 10/14/13 0919  BP:  188/82 213/92 213/88  Pulse:  88 93   Temp:      TempSrc:      Resp:      Height:      Weight:      SpO2: 99%      CBG (last 3)   Recent Labs  10/14/13 0031 10/14/13 0405 10/14/13 0850  GLUCAP 114* 110* 117*    IV Fluid Intake:   . 0.9 % NaCl with KCl 40 mEq / L 20 mL/hr (10/14/13 0153)   MEDICATIONS  . antiseptic oral rinse  15 mL Mouth Rinse BID  . aspirin  300 mg Rectal Daily  . budesonide  0.25 mg Nebulization BID  . cloNIDine  0.2 mg Transdermal Weekly  . famotidine (PEPCID) IV  20 mg Intravenous Q24H  . furosemide  40 mg Intravenous Q12H  . heparin  5,000 Units Subcutaneous  3 times per day  . insulin aspart  0-15 Units Subcutaneous 6 times per day  . levalbuterol  1.25 mg Nebulization Q6H   And  . ipratropium  0.5 mg Nebulization Q6H  . labetalol  20 mg Intravenous Once  . methylPREDNISolone (SOLU-MEDROL) injection  60 mg Intravenous Q6H  . metoprolol  5 mg Intravenous 4 times per day   PRN:  hydrALAZINE, labetalol, levalbuterol, metoprolol, senna-docusate  Diet:  NPO  Activity:   DVT Prophylaxis:  Heparin 5000 units sq tid, SCDs   CLINICALLY SIGNIFICANT STUDIES Basic Metabolic Panel:   Recent Labs Lab 10/12/13 0500 10/13/13 0330  NA 149* 146  K 4.2 4.9  CL 108 111  CO2 29 23  GLUCOSE 114* 138*  BUN 38* 42*  CREATININE 1.41* 1.99*  CALCIUM 8.8 9.1   Liver Function Tests:   Recent Labs Lab 10/11/13 1857  AST 53*  ALT 20  ALKPHOS 68  BILITOT 0.4  PROT 6.1  ALBUMIN 3.2*   CBC:   Recent Labs Lab 10/11/13 1857 10/13/13 0330  WBC 8.9 8.2  NEUTROABS 7.1  --   HGB 9.6* 7.7*  HCT 28.1* 23.7*  MCV 87.3 91.2  PLT 198 199   Coagulation:   Recent Labs Lab 10/11/13 1857  LABPROT 13.9  INR 1.09   Cardiac Enzymes:   Recent Labs Lab 10/11/13 1857 10/11/13 2206 10/12/13 0327 10/12/13 1450  CKTOTAL 710*  --   --   --   CKMB 18.8*  --   --   --   TROPONINI 4.15* 4.85* 4.45* 2.36*   Urinalysis: No results found for this basename: COLORURINE, APPERANCEUR, LABSPEC, PHURINE, GLUCOSEU, HGBUR, BILIRUBINUR, KETONESUR, PROTEINUR, UROBILINOGEN, NITRITE, LEUKOCYTESUR,  in the last 168 hours Lipid Panel    Component Value Date/Time   CHOL 177 10/12/2013 0500   TRIG 121 10/12/2013 0500   HDL 60 10/12/2013 0500   CHOLHDL 3.0 10/12/2013 0500   VLDL 24 10/12/2013 0500   LDLCALC 93 10/12/2013 0500   HgbA1C  Lab Results  Component Value Date   HGBA1C 5.6 10/12/2013    Urine Drug Screen:   No results found for this basename: labopia,  cocainscrnur,  labbenz,  amphetmu,  thcu,  labbarb    Alcohol Level: No results found for this basename: ETH,   in the last 168 hours  CT of the brain  10/12/2013   1. No acute intracranial pathology seen on CT. 2. Diffuse small vessel ischemic microangiopathy again noted; scattered chronic lacunar infarcts within the basal ganglia bilaterally.     CT Angio Head 10/11/2013   1. No acute or subacute cortical infarct is evident. 2. Bilateral basal ganglia infarcts are age indeterminate. 3. 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 4. Segmental stenoses within the PCA branch vessels bilaterally and right greater than left MCA branch vessels. 5. Segmental stenoses within the pericallosal arteries, right greater than left.     MRI of the brain  10/12/2013    1. Acute nonhemorrhagic infarct involving the posterior left lentiform nucleus extending superiorly into the corona radiata. 2. Additional remote lacunar infarcts are present in the basal ganglia, brainstem, inferior cerebellum bilaterally. 3. Atrophy and diffuse white matter changes are present bilaterally including the area of acute infarct. This likely reflects the sequelae of chronic microvascular ischemia. 4. Minimal sinus disease.   2D Echocardiogram  Normal LV size and systolic function, EF 60-65%. Moderate LV hypertrophy. Normal RV size and systolic function. No significant valvular abnormalities.  Carotid Doppler  Findings suggest 60-79% right internal carotid artery stenosis, and 1-39% left internal carotid artery stenosis. Vertebral arteries are patent with antegrade flow.  CXR  10/11/2013    No acute cardiopulmonary disease.     EKG  A-fib with RVR and now in sinus rhythm.    Therapy Recommendations CIR  Physical Exam   Neurologic Examination:  Mental Status:  Alert, oriented, thought content appropriate. Speech fluent without evidence of aphasia but slurred. Able to follow simple commands without difficulty.  Cranial Nerves:  II: Discs flat bilaterally; Visual fields grossly normal, pupils equal, round, reactive to  light and accommodation  III,IV, VI: ptosis not present, patient unable to go beyond midline to the right  V,VII: right facial droop, facial light touch sensation decreased on the right  VIII: hearing normal bilaterally  IX,X: gag reflex reduced  XI: shoulder shrug decreased on the right  XII: midline tongue extension  Motor:  Right : Upper extremity 0/5 Left: Upper extremity 5/5  Lower extremity 0/5 Lower extremity 5/5  Tone and bulk:normal tone throughout; no atrophy noted  Sensory: Pinprick and light touch decreased on the right  Deep Tendon Reflexes: 2+ throughout with an absent left ankle jerk  Plantars:  Right: upgoing Left: mute  Cerebellar:  normal finger-to-nose and normal heel-to-shin testing on the left. Unable to perform on the right  Gait: Unable to test due to weakness  CV: pulses palpable throughout    ASSESSMENT Ms. Terrina Docter is a 73 y.o. female presenting with slurred speech, left eye preference and right hemiparesis as well as R sided neglect.  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 Current infarcts felt to be embolic secondary to atrial fibrillation. On no antithrombotics prior to admission. Now on aspirin suppository 300 mg daily for secondary stroke prevention. Patient with resultant with right hemiparesis, right-sided neurologic neglect, dysarthria, left gaze preference, dysphagia. Stroke work up completed.   Respiratory failure with acute distress this am d/t pulmonary edema  A- fib, converted to sinus, off amio now.   Elevated troponin, trended down to 4 from initial   6. Cardiology consulted. Given severity of stroke, will hold on further evaluation at this point. Will plan nuclear study when she recovers from stroke for risk stratification.  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 Should pt survive, she will need long-term placement. Daughter clear that pt would not want to "have a tube". Dr. Pearlean Brownie discussed with patient what he expects in the best-case scenario. Daughter considering options.  Hospital day # 3  TREATMENT/PLAN  Continue ASA suppository for secondary stroke prevention. Agree with cardiology recommendations for a NOAC. At this time, given risk for hemorrhagic transformation, as well as inability to swallow and anemia, will hold and follow.  Speech to follow swallow  GI consult planned for anemia workup, PEG  CIR/SNF vs palliative care  DNR/DNI  Dr. Pearlean Brownie discussed diagnosis, prognosis,  treatment options and plan of care with daughter.   Annie Main, MSN, RN, ANVP-BC, ANP-BC, [facility] Pager: [phone] 10/14/2013 9:49 AM  I have personally obtained a history, examined the patient, evaluated imaging results, and formulated the assessment and plan of care. I agree with the above. Delia Heady, MD

## 2013-10-14 NOTE — Progress Notes (Signed)
Called by rapid response RN to NTS pt. Upon arrival pt on 2L [place] with SpO2 of 99%. BBS diminished with scattered crackles. Pt stable throughout NTS; small return of white secretions. Pt has gag reflex and able to cough. RT will continue to monitor.

## 2013-10-14 NOTE — Progress Notes (Signed)
Rehab admissions - Evaluated for possible admission.  I met with patient and her daughter.  Patient currently with PT.  Dtr would like for patient to come to inpatient rehab here is hospital.  Dtr does work.  I will open the case with [facility] and request acute inpatient rehab admission.  Call me for questions.  #[phone]

## 2013-10-14 NOTE — Progress Notes (Signed)
eLink Physician-Brief Progress Note Patient Name: Wendy Malone DOB: 05-23-1941 MRN: [id number]  Date of Service  10/14/2013   HPI/Events of Note  Call from nurse reporting AF/RVR along with resp distress.  The nurse had called RR who is at the bedside.  Lopressor had been given.  Patient is HD stable.  Has wheezing noted throughout all the lung fields.  eICU Interventions  Plan: Patient is a full DNR May give additional BB Xopenex ordered prn for wheezing   Intervention Category Intermediate Interventions: Arrhythmia - evaluation and management  DETERDING,ELIZABETH 10/14/2013, 12:55 AM

## 2013-10-14 NOTE — Progress Notes (Signed)
eLink Physician-Brief Progress Note Patient Name: Wendy Bellsnn Baade DOB: 08/16/1940 MRN: [id number]  Date of Service  10/14/2013   HPI/Events of Note  HR has improved with BB.  Hypertensive - used hydralazine for BP control.  Some crackles in bases - concern for pulm edema with hypertension.   eICU Interventions  Plan: PCXR to evaluate lung fields KVO IVFs D/C hydralazine due to tachycardia side effects PRN labetalol for BP control PRN NTS as patient has poor cough   Intervention Category Intermediate Interventions: Respiratory distress - evaluation and management  Prestyn Mahn 10/14/2013, 1:19 AM

## 2013-10-14 NOTE — Progress Notes (Addendum)
Physical Therapy Treatment Patient Details Name: Wendy Malone MRN: [id number] DOB: 27-Nov-1940 Today's Date: 10/14/2013 Time: 4098-1191 PT Time Calculation (min): 22 min  PT Assessment / Plan / Recommendation  History of Present Illness 73 yo with HTN presenting 2/28 with right hemiparesis and facial droop PMHx macular degeneration (Lt eye worse than Rt per daughter).  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  Pt also having episodes of A-fib with RVR rates into the 150s during her stay.     PT Comments   Pt is more alert and participative today during therapy.  She shows active movement of her left side, although not always to command.  Right arm and leg are still flaccid with right neglect on top of macular degeneration.  Daughter in room today and reports she was completely independent PTA (grandson did her yard work and took her trash to the street, but otherwise totally independent).  Pt continues to be appropriate for CIR level therapies at d/c.    Follow Up Recommendations  CIR     Does the patient have the potential to tolerate intense rehabilitation    Yes  Barriers to Discharge   Daughter works and is a single mom, so she will not be able to provide 24/7 assist.  She is looking into ALF.        Equipment Recommendations  Wheelchair (measurements PT);Wheelchair cushion (measurements PT);Hospital bed    Recommendations for Other Services   None  Frequency Min 4X/week   Progress towards PT Goals Progress towards PT goals: Progressing toward goals  Plan Current plan remains appropriate    Precautions / Restrictions Precautions Precautions: Fall;Other (comment) (limited vision/macular degeneration)   Pertinent Vitals/Pain HR max 95 in NSR throughout session.      Mobility  Bed Mobility Overal bed mobility: Needs Assistance;+2 for physical assistance Bed Mobility: Supine to Sit;Sit to Supine Supine to sit: +2 for physical assistance;Max assist Sit to supine: +2 for physical assistance;Max assist General bed mobility comments: Pt moving her left leg and left arm, but not much for the actual assit or initiation of movement to sitting on the left side of the bed.  She did grasp onto therapist during transition with her left hand likely due to fear of falling over.   Modified Rankin (Stroke Patients Only) Pre-Morbid Rankin Score: No symptoms Modified Rankin: Severe disability      PT Goals (current goals can now be found in the care plan section) Acute Rehab PT Goals Patient Stated Goal: uhable to state.  Daughter would like for her to go to CIR level therapies at d/c from acute hospital setting.   Visit Information  Last PT Received On: 10/14/13 Assistance Needed: +2 History of Present Illness: 73 yo with HTN presenting 2/28 with right hemiparesis and facial droop PMHx macular degeneration (Lt eye worse than Rt per daughter).  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  Pt also having episodes of A-fib with RVR rates into the 150s during her stay.      Subjective Data  Subjective: Pt is happy and petting her yellow bunny that was given to her to keep her from pulling at all of her lines.  Pt is petting and kissing the bunny.  She is also attempting verbalization.   Patient Stated Goal: uhable to state.  Daughter would like for her to go to CIR level therapies at d/c from acute hospital setting.    Cognition  Cognition Arousal/Alertness: Awake/alert Behavior During Therapy: Restless Overall Cognitive Status: Impaired/Different from  baseline Area of Impairment: Attention;Following commands;Safety/judgement;Awareness Current Attention Level: Focused Following Commands: Follows one step commands inconsistently;Follows one step commands with increased time Safety/Judgement: Decreased awareness of safety;Decreased awareness of deficits Awareness: Intellectual (if any at all) General Comments: Pt needs constant redirection, multiple cues to attend to task at hand.  Assist for initiation of movement.   Difficult to assess due to: Impaired communication    Balance  Balance Overall balance assessment: Needs assistance Sitting-balance support: Single extremity supported;Feet supported;Bilateral upper extremity supported Sitting balance-Leahy Scale: Zero Sitting balance - Comments: Manual assist to help pt prop with bil hands (therapist holding right hand in position for WB, no active movement in this extremity).  Spent 10 mins EOB working on sitting tolerance (total assist to support trunk to prevent posterior and right LOB as well as anterior LOB when pt leans forward) and finding things past midline (used yellow bunny because it was bright).  Pt was able to come across midline with repetition of both head movement and gaze.  Her macular degeneration and now right neglect are both affecting her ability to find objects.   Postural control: Posterior lean;Right lateral lean General Comments General comments (skin integrity, edema, etc.): Pt reaching for her left knee in sitting, grimicing and indicating that it hurts.  Once supine in bed ligamentous testing preformed and all seem to be intact.  No obvious bruising in her left knee (significant bruising in her right).  Educated daughter on sitting on right side (which she was already doing) and using the bunny to try to get her mom to reach to the right side of her body.  HR max during session 95 and in NSR.    End of Session PT - End of Session Equipment Utilized During Treatment:  Oxygen Activity Tolerance: Patient limited by fatigue;Patient limited by pain Patient left: in bed;with call bell/phone within reach;with family/visitor present     Lurena JoinerRebecca B. Jejuan Scala, PT, DPT [phone]   10/14/2013, 12:53 PM

## 2013-10-14 NOTE — Progress Notes (Signed)
Patient converted to Afib around midnight with HR in 150-160's sustaining and systolic BP was >200. Patient converted back to NSR at 0250 ; Dr Deterding  Made aware and orders received.  Patient continues to breath using accessory muscles and systolic BP is still >200.  PRN Labetalol given at 0143, 0413 and 0749.  Report given to incoming nurse.

## 2013-10-14 NOTE — Progress Notes (Signed)
Patient ID: Wendy Malone  female  VHQ:[id number]    DOB: 17-Dec-1940    DOA: 10/11/2013  PCP: No primary provider on file.  Brief history of present illness 73 years old female with PMH relevant for HTN and GERD. Presented with right hemiparesis and facial droop. Last seen normal the day prior. Out of window for TPA. Patient was alert awake and oriented and protecting her airway. She was hemodynamically stable however in Intermittent rapid A. Fib on amiodarone drip.  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 likely precipitated due to atrial fibrillation - Patient seen and examined this morning, alert with dysarthria, able to follow simple commands - Stroke service following, continue aspirin suppository for secondary stroke prevention - Cardiology also has been following, recommending new anticoagulation agents, currently on hold due to possibility of hemorrhagic transformation and inability to swallow - Stroke workup complete, evaluated by CIR  Uncontrolled hypertension - Placed on clonidine patch, on scheduled metoprolol, on IV labetalol and hydralazine as needed   Acute respiratory failure with hypoxia/distress with diffuse wheezing bilaterally, due to pulmonary edema, diastolic dysfunction - Patient had rapid response this morning with acute respiratory distress, chest x-ray shows pulmonary edema, ordered BNP which was elevated at  29,115.0 - Placed on IV salmeterol, scheduled to Xopenex and Atrovent nebs, Pulmicort - Gave 40 mg IV Lasix x1, and placed on lasix 40mg  IV q12hrs - 2-D echo was done 3/1 which had shown EF of 60-65% with grade 1 diastolic dysfunction   Dysphagia - GI consult called for anemia workup and possible PEG tube placement. Discussed in detail with the patient's daughter at the bedside . This was discussed by neurology service as well. Prognosis for full recovery is poor and will need long-term placement. At the time of my discussion patient's daughter was agreeing to PEG tube placement and stated that "hopefully it will not be permanent" but subsequently appears to have changed her mind with neurology discussions. GI consult has been called, will follow recommendations. If patient's daughter, does not want PEG tube, will need to have palliative goals of care    Atrial fibrillation  - Paroxysmal, currently in sinus rhythm, on IV Lopressor.  - Currently on aspirin PR, per cardiology, will need to be anticoagulated when cleared by neurology   - also workup for anemia prior to starting anticoagulation   Anemia - B12 folate normal, 42, ferritin 92 -Discussed in detail with the patient's daughter, her mother did not want any invasive procedures/colonoscopy when her primary care physician had suggested In December 2014  Acute kidney failure  - Please note that the Lasix was started by me today for acute respiratory distress/pulmonary edema/. Likely her acute renal dysfunction was precipitated by her n.p.o. status and malignant hypertension and decreased renal perfusion. Last creatinine 1.9 on 3/2   DVT Prophylaxis:  Code Status: DO NOT RESUSCITATE status   Family Communication: discussed in detail with patient's daughter at the bedside   Disposition: will likely need skilled nursing  facility   Consultants:  Neurology    cardiology  Gastroenterology called today  Procedures:  Multiple  procedures, 2-D echo, carotid Dopplers, MRI/MRA   Antibiotics:  None     Subjective: The patient seen and examined, diffusely wheezing having difficulty breathing, uncontrolled BP in 200s   Objective: Weight change:   Intake/Output Summary (Last 24 hours) at 10/14/13 1332 Last data filed at 10/14/13 1227  Gross per 24 hour  Intake      0 ml  Output   1200 ml  Net  -1200 ml   Blood pressure 197/69, pulse 91, temperature 98.8 F (37.1 C), temperature source Oral, resp. rate 18, height 5' (1.524 m), weight 63.4 kg (139 lb 12.4 oz), SpO2 100.00%.  Physical Exam: General: Alert and awake, in mild respiratory distress  CVS: S1-S2 clear, no murmur rubs or gallops Chest:  diffuse wheezing bilaterally  Abdomen: soft nontender, nondistended, normal bowel sounds  Extremities: no cyanosis, clubbing or edema noted bilaterally Neuro:  right hemiparesis  Lab Results: Basic Metabolic Panel:  Recent Labs Lab 10/12/13 0500 10/13/13 0330  NA 149* 146  K 4.2 4.9  CL 108 111  CO2 29 23  GLUCOSE 114* 138*  BUN 38* 42*  CREATININE 1.41* 1.99*  CALCIUM 8.8 9.1   Liver Function Tests:  Recent Labs Lab 10/11/13 1857  AST 53*  ALT 20  ALKPHOS 68  BILITOT 0.4  PROT 6.1  ALBUMIN 3.2*   No results found for this basename: LIPASE, AMYLASE,  in the last 168 hours No results found for this basename: AMMONIA,  in the last 168 hours CBC:  Recent Labs Lab 10/11/13 1857 10/13/13 0330  WBC 8.9 8.2  NEUTROABS 7.1  --   HGB 9.6* 7.7*  HCT 28.1* 23.7*  MCV 87.3 91.2  PLT 198 199   Cardiac Enzymes:  Recent Labs Lab 10/11/13 1857 10/11/13 2206 10/12/13 0327 10/12/13 1450  CKTOTAL 710*  --   --   --   CKMB 18.8*  --   --   --   TROPONINI 4.15* 4.85* 4.45* 2.36*   BNP: No components found with this basename: POCBNP,  CBG:  Recent Labs Lab 10/13/13 1942 10/14/13 0031 10/14/13 0405 10/14/13 0850 10/14/13 1224  GLUCAP 106* 114* 110* 117* 130*     Micro  Results: Recent Results (from the past 240 hour(s))  MRSA PCR SCREENING     Status: None   Collection Time    10/11/13  9:40 PM      Result Value Ref Range Status   MRSA by PCR NEGATIVE  NEGATIVE Final   Comment:            The GeneXpert MRSA Assay (FDA     approved for NASAL specimens     only), is one component of a     comprehensive MRSA colonization     surveillance program. It is not     intended to diagnose MRSA     infection nor to guide or     monitor treatment for     MRSA infections.    Studies/Results: Ct Angio Head W/cm &/or Wo Cm  10/11/2013   CLINICAL DATA:  Code stroke. Right-sided weakness and slurred speech.  EXAM: CT ANGIOGRAPHY HEAD  TECHNIQUE: Multidetector CT imaging of the head was performed using the standard protocol during bolus administration of intravenous contrast. Multiplanar CT image reconstructions and MIPs were obtained to evaluate the vascular anatomy.  CONTRAST:  50mL OMNIPAQUE IOHEXOL 350 MG/ML SOLN  COMPARISON:  None.  FINDINGS: Gray-white differentiation is preserved the source images and postcontrast images without evidence for a focal cortical infarct. There are multiple lacunar infarcts within the basal ganglia bilaterally which are age-indeterminate. Extensive periventricular white matter hypoattenuation is present bilaterally.  No hemorrhage or mass lesion is evident.  The cervical internal carotid arteries are within normal limits bilaterally. Calcified and noncalcified plaque is present within the cavernous carotid arteries bilaterally. There is a moderate stenosis of the right cavernous carotid artery just proximal to the genu. Mild to moderate narrowing is present in the supra clinoid left internal carotid artery. Supra clinoid segments are normal bilaterally. The A1 and M1 segments are normal bilaterally. The anterior communicating artery is patent.  Segmental narrowing within the pericallosal arteries is worse on the right. There is segmental  narrowing of distal MCA branch vessels bilaterally, right greater than left.  The left vertebral artery is dominant. The right PICA origin is visualized and normal. The left AICA is dominant. The basilar artery is within normal limits. Both posterior cerebral arteries originate from the basilar tip. Segmental stenoses are present in the proximal PCA branch vessels bilaterally. The dural sinuses are patent.  Review of the MIP images confirms the above findings.  IMPRESSION: 1. No acute or subacute cortical infarct is evident. 2. Bilateral basal ganglia infarcts are age indeterminate. 3. 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 4. Segmental stenoses within the PCA branch vessels bilaterally and right greater than left MCA branch vessels. 5. Segmental stenoses within the pericallosal arteries, right greater than left.   Electronically Signed   By: Gennette Pac M.D.   On: 10/11/2013 21:31   Dg Chest 2 View  10/11/2013   CLINICAL DATA:  Stroke.  EXAM: CHEST  2 VIEW  COMPARISON:  None.  FINDINGS: Cardiac silhouette is mildly enlarged. Normal mediastinal and hilar contours.  There are prominent bronchovascular markings. Lungs are otherwise clear. No pleural effusion or pneumothorax.  The bony thorax is intact.  IMPRESSION: No acute cardiopulmonary disease.   Electronically Signed   By: Amie Portland M.D.   On: 10/11/2013 21:03   Ct Head Wo Contrast  10/12/2013   CLINICAL DATA:  Altered mental status.  Unable to speak.  EXAM: CT HEAD WITHOUT CONTRAST  TECHNIQUE: Contiguous axial images were obtained from the base of the skull through the vertex without intravenous contrast.  COMPARISON:  CTA of the head performed 10/11/2013  FINDINGS: There is no evidence of acute infarction, mass lesion, or intra- or extra-axial hemorrhage on CT.  Diffuse periventricular and subcortical white matter change likely reflects small vessel ischemic microangiopathy. Scattered chronic lacunar infarcts are  seen within the basal ganglia bilaterally.  The posterior fossa, including the cerebellum, brainstem and fourth ventricle, is within normal limits. The third and lateral ventricles are unremarkable in appearance. The cerebral hemispheres demonstrate grossly normal gray-white differentiation. No mass effect or midline shift is seen.  There is no evidence of fracture; visualized osseous structures are unremarkable in appearance. The visualized portions of the orbits are within normal limits. The paranasal sinuses and mastoid air cells are well-aerated. No significant soft tissue abnormalities are seen.  IMPRESSION: 1. No acute intracranial pathology seen on CT. 2. Diffuse small vessel ischemic microangiopathy again noted; scattered chronic lacunar infarcts within the basal ganglia bilaterally.   Electronically Signed   By: Roanna Raider M.D.   On: 10/12/2013 03:26   Mr Brain Wo Contrast  10/12/2013   CLINICAL DATA:  Slurred speech. Ischemic stroke. Right hemiparesis and facial droop.  EXAM: MRI HEAD WITHOUT CONTRAST  TECHNIQUE: Multiplanar, multiecho pulse sequences of the brain and surrounding structures were obtained without intravenous contrast.  COMPARISON:  CT head without contrast 10/12/2013.  FINDINGS: Diffusion-weighted images demonstrate an acute nonhemorrhagic infarct involving the posterior left lentiform nucleus extending into the body of caudate and coronal radiata. The area measures 3.2 x 2.5 x 1.4 cm. The area is present within the region of diffuse white matter disease. Advanced atrophy and extensive white matter changes are present bilaterally. Remote lacunar infarcts are present in the brainstem and inferior cerebellum bilaterally. Remote lacunar infarcts are present in the basal ganglia bilaterally as well.  Flow is present in the major intracranial arteries. The globes and orbits are intact. Mild mucosal thickening is present in the sphenoid sinuses bilaterally. There is minimal mucosal  thickening in the ethmoid air cells and inferior left frontal sinus. The mastoid air cells are clear.  IMPRESSION: 1. Acute nonhemorrhagic infarct involving the posterior left lentiform nucleus extending superiorly into the corona radiata. 2. Additional remote lacunar infarcts are present in the basal ganglia, brainstem, inferior cerebellum bilaterally. 3. Atrophy and diffuse white matter changes are present bilaterally including the area of acute infarct. This likely reflects the sequelae of chronic microvascular ischemia. 4. Minimal sinus disease. These results were called by telephone at the time of interpretation on 10/12/2013 at 11:55 AM to Dr. Loretha BrasilZEYLIKMAN, who verbally acknowledged these results.   Electronically Signed   By: Gennette Pachris  Mattern M.D.   On: 10/12/2013 11:58   Dg Chest Port 1 View  10/14/2013   CLINICAL DATA:  Respiratory distress.  EXAM: PORTABLE CHEST - 1 VIEW  COMPARISON:  There prior  FINDINGS: Cardiac silhouette remains mildly enlarged, mediastinal silhouette is nonsuspicious, calcified aortic knob. Diffuse interstitial prominence increased from prior examination with patchy bibasilar airspace opacities. Small right pleural effusion. No pneumothorax.  Patient is osteopenic. Left antecubital intravenous catheter in place. Multiple EKG lines overlie the patient and may obscure subtle underlying pathology.  IMPRESSION: Stable cardiomegaly with increasing interstitial prominence suggesting pulmonary edema, mild patchy airspace opacity lung bases may reflect confluent edema, atelectasis, less likely pneumonia with small right pleural effusion.   Electronically Signed   By: Awilda Metroourtnay  Bloomer   On: 10/14/2013 05:03    Medications: Scheduled Meds: . antiseptic oral rinse  15 mL Mouth Rinse BID  . aspirin  300 mg Rectal Daily  . budesonide  0.25 mg Nebulization BID  . cloNIDine  0.2 mg Transdermal Weekly  . famotidine (PEPCID) IV  20 mg Intravenous Q24H  . furosemide  40 mg Intravenous Q12H  .  heparin  5,000 Units Subcutaneous 3 times per day  . insulin aspart  0-15 Units Subcutaneous 6 times per day  . levalbuterol  1.25 mg Nebulization Q6H   And  . ipratropium  0.5 mg Nebulization Q6H  . labetalol  20 mg Intravenous Once  . methylPREDNISolone (SOLU-MEDROL) injection  60 mg Intravenous Q6H  . metoprolol  5 mg Intravenous 4 times per day      LOS: 3 days   Wallis Vancott M.D. [facility] 10/14/2013, 1:32 PM Pager: [phone]  If 7PM-7AM, please contact night-coverage [email or web address] Password [email or web address]

## 2013-10-15 LAB — BASIC METABOLIC PANEL
BUN: 52 mg/dL — ABNORMAL HIGH (ref 6–23)
CHLORIDE: 118 meq/L — AB (ref 96–112)
CO2: 22 mEq/L (ref 19–32)
Calcium: 9.7 mg/dL (ref 8.4–10.5)
Creatinine, Ser: 1.76 mg/dL — ABNORMAL HIGH (ref 0.50–1.10)
GFR calc Af Amer: 33 mL/min — ABNORMAL LOW (ref >90)
GFR calc non Af Amer: 28 mL/min — ABNORMAL LOW (ref >90)
GLUCOSE: 139 mg/dL — AB (ref 70–99)
Potassium: 4 mEq/L (ref 3.7–5.3)
SODIUM: 159 meq/L — AB (ref 137–147)

## 2013-10-15 LAB — GLUCOSE, CAPILLARY
GLUCOSE-CAPILLARY: 135 mg/dL — AB (ref 70–99)
GLUCOSE-CAPILLARY: 140 mg/dL — AB (ref 70–99)
GLUCOSE-CAPILLARY: 220 mg/dL — AB (ref 70–99)
Glucose-Capillary: 135 mg/dL — ABNORMAL HIGH (ref 70–99)
Glucose-Capillary: 125 mg/dL — ABNORMAL HIGH (ref 70–99)
Glucose-Capillary: 135 mg/dL — ABNORMAL HIGH (ref 70–99)

## 2013-10-15 LAB — CBC
HEMATOCRIT: 26.1 % — AB (ref 36.0–46.0)
Hemoglobin: 8.2 g/dL — ABNORMAL LOW (ref 12.0–15.0)
MCH: 29.1 pg (ref 26.0–34.0)
MCHC: 31.4 g/dL (ref 30.0–36.0)
MCV: 92.6 fL (ref 78.0–100.0)
Platelets: 205 10*3/uL (ref 150–400)
RBC: 2.82 MIL/uL — AB (ref 3.87–5.11)
RDW: 17.5 % — ABNORMAL HIGH (ref 11.5–15.5)
WBC: 6.4 10*3/uL (ref 4.0–10.5)

## 2013-10-15 MED ORDER — RESOURCE THICKENUP CLEAR PO POWD
ORAL | Status: DC | PRN
  Filled 2013-10-15: qty 125

## 2013-10-15 MED ORDER — IPRATROPIUM BROMIDE 0.02 % IN SOLN: 0.5000 mg | Freq: Four times a day (QID) | RESPIRATORY_TRACT | Status: DC

## 2013-10-15 MED ORDER — IPRATROPIUM BROMIDE 0.02 % IN SOLN
0.5000 mg | RESPIRATORY_TRACT | Status: DC | PRN
  Administered 2013-10-17: 0.5 mg via RESPIRATORY_TRACT
  Filled 2013-10-15: qty 2.5

## 2013-10-15 MED ORDER — ENSURE PUDDING PO PUDG
1.0000 | Freq: Two times a day (BID) | ORAL | Status: DC
  Administered 2013-10-15: 1 via ORAL

## 2013-10-15 MED ORDER — FAMOTIDINE 20 MG PO TABS
20.0000 mg | ORAL_TABLET | Freq: Every day | ORAL | Status: DC
  Administered 2013-10-17 – 2013-10-20 (×4): 20 mg via ORAL
  Filled 2013-10-15 (×6): qty 1

## 2013-10-15 MED ORDER — LEVALBUTEROL HCL 1.25 MG/0.5ML IN NEBU
1.2500 mg | INHALATION_SOLUTION | RESPIRATORY_TRACT | Status: DC | PRN
  Administered 2013-10-17: 1.25 mg via RESPIRATORY_TRACT
  Filled 2013-10-15: qty 0.5

## 2013-10-15 MED ORDER — METOPROLOL TARTRATE 1 MG/ML IV SOLN
5.0000 mg | Freq: Once | INTRAVENOUS | Status: AC
  Administered 2013-10-15: 5 mg via INTRAVENOUS

## 2013-10-15 MED ORDER — FUROSEMIDE 10 MG/ML IJ SOLN
20.0000 mg | Freq: Two times a day (BID) | INTRAMUSCULAR | Status: DC
  Administered 2013-10-15: 20 mg via INTRAVENOUS
  Filled 2013-10-15 (×2): qty 2

## 2013-10-15 MED ORDER — LEVALBUTEROL HCL 1.25 MG/0.5ML IN NEBU: 1.2500 mg | INHALATION_SOLUTION | RESPIRATORY_TRACT | Status: DC | PRN

## 2013-10-15 NOTE — Care Management Note (Unsigned)
    Page 1 of 1   10/15/2013     11:16:43 AM   CARE MANAGEMENT NOTE 10/15/2013  Patient:  Wendy Malone,Wendy Malone   Account Number:  [id number]  Date Initiated:  10/13/2013  Documentation initiated by:  Avie ArenasBROWN,SARAH  Subjective/Objective Assessment:   ?? stroke?? - afib with RVR -     Action/Plan:   Anticipated DC Date:  10/17/2013   Anticipated DC Plan:  HOME W HOME HEALTH SERVICES      DC Planning Services  CM consult      Choice offered to / List presented to:             Status of service:  In process, will continue to follow Medicare Important Message given?   (If response is "NO", the following Medicare IM given date fields will be blank) Date Medicare IM given:   Date Additional Medicare IM given:    Discharge Disposition:    Per UR Regulation:  Reviewed for med. necessity/level of care/duration of stay  If discussed at Long Length of Stay Meetings, dates discussed:   10/16/2013    Comments:  Contact:  Wendy CamaraLori Malone - daughter - [phone]  10-15-13 [address] Wendy Hemrick Graves-Bigelow, RN,BSN -[phone] Pt admitted for stroke and afib with RVR on cardizem gtt. Per MD notes troponin, trended down to 4 from initial 6. Cardiology consulted- Plan nuclear study when she recovers from stroke for risk stratification. Dysphagia-failed bedside swallow eval plans for temporary means of feeding suggested-Daughter considering PEG. Plan CIR vs SNF. CM will continue to monitor.

## 2013-10-15 NOTE — Progress Notes (Signed)
Physical Therapy Treatment Patient Details Name: Wendy Malone MRN: [id number] DOB: Jun 01, 1941 Today's Date: 10/15/2013 Time: [id number] PT Time Calculation (min): 32 min  PT Assessment / Plan / Recommendation  History of Present Illness 73 yo with HTN presenting 2/28 with right hemiparesis and facial droop PMHx macular degeneration (Lt eye worse than Rt per daughter).  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  Pt also having episodes of A-fib with RVR rates into the 150s during her stay.     PT Comments   Pt is progressing daily with therapy.  She was able to stand and pivot to the recliner chair today.  HR still seems to be spiking at times during mobility (max 125), but comes quickly back down (85 bpm).  Pt works very hard throughout session and is very motivated to get better.  She is starting to push with her left arm to the right.  She continues to be an excellent inpatient rehab candidate.    Follow Up Recommendations  CIR     Does the patient have the potential to tolerate intense rehabilitation    Yes  Barriers to Discharge   Daughter is unable to provide 24/7 assist as she works and is a single mom.        Equipment Recommendations  Wheelchair (measurements PT);Wheelchair cushion (measurements PT);Hospital bed;Other (comment) (hoyer lift)    Recommendations for Other Services   NA  Frequency Min 4X/week   Progress towards PT Goals Progress towards PT goals: Progressing toward goals  Plan Current plan remains appropriate    Precautions / Restrictions Precautions Precautions: Fall;Other (comment) (left field cut on top of macular degeneration) Precaution Comments: Pt with right sided weakness, left gaze preference, has been moving in and out of A-fib with RVR, monitor HR    Pertinent Vitals/Pain HR max 125 briefly, then back into the 80s.     Mobility  Bed Mobility Overal bed mobility: Needs Assistance Bed Mobility: Supine to Sit Supine to sit: +2 for physical assistance;Max assist Sit to supine: +2 for physical assistance;Max assist General bed mobility comments: Once therapist initiated movement to right side of bed today pt was able to assist with moving her left leg over.  Right leg remains no active movement.  Pt pushing against rail and reaching for therapist during transition to steady herself.  Once sitting reaching for armrest of chair for balance, but easily distracted by lines and needing more assist when she lets go of armrest of chair for sitting balance.   Transfers Overall transfer level: Needs assistance Transfers: Sit to/from Stand;Stand Pivot Transfers Sit to Stand: +2 physical assistance;Max assist Stand pivot transfers: +2 physical assistance;Max assist General transfer comment: Initially in standing and going to stand pt pushing to the right.  Once left arm taken from recliner chair armrest and pt aske to lean towards therapist on the right, less pushing noted.  Used the bed pad to extend hips and pt able to take some weight on her left leg.  Pt started reaching with her left hand towards the recliner chair on her left, but was unable to weight shift enough to her right to take a step.  Therapists pivoted body around and helped lower into the recliner chair.  PT able to pull trun forward with mod assist to help scoot back into the chair for positioning.  Modified Rankin (Stroke Patients Only) Pre-Morbid Rankin Score: No symptoms Modified Rankin: Severe disability      PT Goals (current goals can now be found in the care plan section) Acute Rehab PT Goals Patient Stated Goal: uhable to state.  Daughter would like for her to go to CIR level therapies at d/c from acute hospital setting.   Visit Information  Last PT Received On:  10/15/13 Assistance Needed: +2 History of Present Illness: 73 yo with HTN presenting 2/28 with right hemiparesis and facial droop PMHx macular degeneration (Lt eye worse than Rt per daughter).  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  Pt also having episodes of A-fib with RVR rates into the 150s during her stay.      Subjective Data  Subjective: Pt with continued left gaze preference with difficulty coming past midline.  Daughter reports that she (the daughter) has been struggling emotionally with all of the responsibility and how quickly she needs to make decisions for her mom.  Lawson Fiscal, the daughter is the only child.   Patient Stated Goal: uhable to state.  Daughter would like for her to go to CIR level therapies at d/c from acute hospital setting.    Cognition  Cognition Arousal/Alertness: Awake/alert Behavior During Therapy: Restless (pulling at lines and at times frustrated by the tangle) Overall Cognitive Status: Impaired/Different from baseline Area of Impairment: Attention;Following commands;Safety/judgement;Awareness;Problem solving Current Attention Level: Focused Memory: Decreased recall of precautions Following Commands: Follows one step commands inconsistently;Follows one step commands with increased time Safety/Judgement: Decreased awareness of safety;Decreased awareness of deficits Awareness: Intellectual (if any) Problem Solving: Slow processing;Decreased initiation;Difficulty sequencing;Requires verbal cues;Requires tactile cues General Comments: Pt continues to need constant verbal and tactile cues to attend to task, she is laughing at appropriate time and is doing better when attending at following simple one step commands with verbal and tactile cues as well as  demonstation.  She is less delayed in her processing, but still delayed more than usual.   Difficult to assess due to: Impaired communication    Balance  Balance Overall balance assessment: Needs assistance Sitting-balance support: Feet supported;Single extremity supported Sitting balance-Leahy Scale: Poor Sitting balance - Comments: Better sitting balance today with left arm supported on armrest of the chair.  Still lmax assist to maintain balance and find midline sitting posture.   Postural control: Posterior lean;Right lateral lean Standing balance support: Single extremity supported Standing balance-Leahy Scale: Zero General Comments General comments (skin integrity, edema, etc.): HR max 125 during session.  Monitor reporting "irregular HR" and HR came back down quickly to 85bpm in sitting.    End of Session PT - End of Session Equipment Utilized During Treatment: Gait belt Activity Tolerance: Patient limited by fatigue Patient left: in chair;with call bell/phone within reach;with family/visitor present Nurse Communication: Mobility status;Need for lift equipment        Pedrohenrique Mcconville B. Anasofia Micallef, PT, DPT [phone]   10/15/2013, 12:09 PM

## 2013-10-15 NOTE — Progress Notes (Signed)
INITIAL NUTRITION ASSESSMENT  DOCUMENTATION CODES Per approved criteria  -Not Applicable   INTERVENTION: Monitor PO intake Provide Magic Cup BID and Ensure Pudding BID  If PO intake remains inadequate, will initiate TF. Initiate Osmolite 1.2 @ 20 ml/hr via NGT and increase by 10 ml every 4 hours to goal rate of 55 ml/hr.  At goal rate, tube feeding regimen will provide 1584 kcal, 73 grams of protein, and 1082 ml of H2O.    NUTRITION DIAGNOSIS: Inadequate oral intake related to recent stroke and dysphagia as evidenced by NPO status x 4 days.   Goal: Pt to meet >/= 90% of their estimated nutrition needs   Monitor:  PO intake, weight, labs, TF initiation/tolerance  Reason for Assessment: Consult for TF initiation/management  73 y.o. female  Admitting Dx: CVA (cerebral infarction)  ASSESSMENT: 73 years old female with PMH relevant for HTN and GERD. Presents with right hemiparesis and facial droop.  RD consulted for tube feed initiation and management. Pt admitted 2/28 and has been NPO since. Diet advanced to Dysphagia 1 diet with honey-thick liquids at 1300 hr today. Pt has not yet had any food per RN; will wait to see how pt does at dinner tonight and breakfast tomorrow before placing feeding tube.  Pt states that she was eating well PTA and denies any recent weight loss but, she reports that she usually weighs 150 lbs and her most recent weight was 139 lbs. Noticeable muscle and fat wasting in physical exam; suspect some form of malnutrition but, unable to confirm at this time based on nutrition-related malnutrition criteria.  Unsure if pt's daughter has decided on panda/ PEG at this time.   Nutrition Focused Physical Exam:  Subcutaneous Fat:  Orbital Region: wnl Upper Arm Region: moderate wasting Thoracic and Lumbar Region: NA  Muscle:  Temple Region: moderate wasting Clavicle Bone Region: mild wasting Clavicle and Acromion Bone Region: mild wasting Scapular Bone Region:  NA Dorsal Hand: mild wasting Patellar Region: mild wasting Anterior Thigh Region: moderate wasting Posterior Calf Region: mild wasting  Edema: +1 RUE edema   Height: Ht Readings from Last 1 Encounters:  10/11/13 5' (1.524 m)    Weight: Wt Readings from Last 1 Encounters:  10/11/13 139 lb 12.4 oz (63.4 kg)    Ideal Body Weight: 100 lbs  % Ideal Body Weight: 139%  Wt Readings from Last 10 Encounters:  10/11/13 139 lb 12.4 oz (63.4 kg)    Usual Body Weight: 150 lbs  % Usual Body Weight: 93%  BMI:  Body mass index is 27.3 kg/(m^2).  Estimated Nutritional Needs: Kcal: 1450-1650 Protein: 70-85 grams Fluid: 1.6 L/day  Skin: +1 RUE edema  Diet Order: Dysphagia  EDUCATION NEEDS: -No education needs identified at this time   Intake/Output Summary (Last 24 hours) at 10/15/13 1536 Last data filed at 10/15/13 0659  Gross per 24 hour  Intake    323 ml  Output   1700 ml  Net  -1377 ml    Last BM: 3/1   Labs:   Recent Labs Lab 10/12/13 0500 10/13/13 0330 10/15/13 0820  NA 149* 146 159*  K 4.2 4.9 4.0  CL 108 111 118*  CO2 29 23 22   BUN 38* 42* 52*  CREATININE 1.41* 1.99* 1.76*  CALCIUM 8.8 9.1 9.7  GLUCOSE 114* 138* 139*    CBG (last 3)   Recent Labs  10/15/13 0423 10/15/13 0732 10/15/13 1129  GLUCAP 135* 125* 135*    Scheduled Meds: . antiseptic oral  rinse  15 mL Mouth Rinse BID  . aspirin  300 mg Rectal Daily  . budesonide  0.25 mg Nebulization BID  . cloNIDine  0.2 mg Transdermal Weekly  . [START ON 10/16/2013] famotidine  20 mg Oral Daily  . furosemide  20 mg Intravenous BID  . heparin  5,000 Units Subcutaneous 3 times per day  . insulin aspart  0-15 Units Subcutaneous 6 times per day  . levalbuterol  1.25 mg Nebulization Q6H   And  . ipratropium  0.5 mg Nebulization Q6H  . labetalol  20 mg Intravenous Once  . methylPREDNISolone (SOLU-MEDROL) injection  60 mg Intravenous Q6H  . metoprolol  5 mg Intravenous 4 times per day     Continuous Infusions: . diltiazem (CARDIZEM) infusion 5 mg/hr (10/15/13 0214)    Past Medical History  Diagnosis Date  . Hypertension     since December 2014  . GERD (gastroesophageal reflux disease)   . Macular degeneration     History reviewed. No pertinent past surgical history.  Ian Malkin RD, LDN Inpatient Clinical Dietitian Pager: [phone] After Hours Pager: [phone]

## 2013-10-15 NOTE — Progress Notes (Signed)
RT tried numerous times to get the patient to take the treatment. Every time I tried to put the mask on or hold it to her face she moved my hand. Patient in no apparent distress at this time and vitals within normal limits.

## 2013-10-15 NOTE — Evaluation (Signed)
Speech Language Pathology Evaluation Patient Details Name: Wendy Malone Gerlich MRN: [id number] DOB: [birth year] Today's Date: 10/15/2013 Time: [id number] SLP Time Calculation (min): 35 min  Problem List:  Patient Active Problem List   Diagnosis Date Noted  . CVA (cerebral infarction) 10/14/2013  . Atrial fibrillation 10/12/2013  . Ischemic stroke diagnosed during current admission 10/11/2013   Past Medical History:  Past Medical History  Diagnosis Date  . Hypertension     since December 2014  . GERD (gastroesophageal reflux disease)   . Macular degeneration    Past Surgical History: History reviewed. No pertinent past surgical history. HPI:  Wendy Malone Cornick is a 73 y.o. female with HTN and GERD who [facility] with acute onset slurred speech, right sided hemiparesis and facial droop, found to have acute left brain CVA. MRI brain shows Acute nonhemorrhagic infarct involving the posterior left lentiform nucleus extending superiorly into the corona radiata. . Additional remote lacunar infarcts are present in the basal ganglia, brainstem, inferior cerebellum bilaterally.Initial NIHSS on presentation to [facility] of 18.    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  Patient is hightly distractable and impulsive, with decreased awareness and judgement. Pt. would benefit from CIR but will need likely 24/7 supervision and at least moderate assist after d/c.     SLP Assessment  Patient needs continued Speech Lanaguage Pathology Services    Follow Up Recommendations  Inpatient Rehab    Frequency and Duration min 3x week  2 weeks   Pertinent Vitals/Pain n/a   SLP Goals see care plan SLP Goals Potential to Achieve Goals: Good Progress/Goals/Alternative treatment plan discussed with pt/caregiver and they: Agree  SLP Evaluation Prior Functioning  Cognitive/Linguistic Baseline: Within functional limits Type of Home: House Available Help at Discharge: Family Vocation: Retired   IT consultantCognition  Overall Cognitive Status: Impaired/Different from baseline Arousal/Alertness: Awake/alert Orientation Level: Other (comment) Attention: Sustained Sustained Attention: Impaired Sustained Attention Impairment: Functional basic Memory: Impaired (Unable to assess due to aphasia) Awareness: Impaired Awareness Impairment: Emergent impairment Problem Solving: Impaired Problem Solving Impairment: Functional basic Behaviors: Impulsive Safety/Judgment: Impaired    Comprehension  Auditory Comprehension Overall Auditory Comprehension: Impaired Yes/No Questions: Impaired Basic Biographical Questions: 26-50% accurate Basic Immediate Environment Questions: 25-49% accurate Commands: Impaired One Step Basic Commands: 25-49% accurate Interfering Components: Attention;Visual impairments;Motor planning;Processing speed EffectiveTechniques: Extra processing time;Pausing;Repetition;Visual/Gestural cues Visual Recognition/Discrimination Discrimination: Exceptions to [facility] Common Objects: Unable to indentify Reading Comprehension Reading Status: Not tested    Expression Expression Primary Mode of Expression: Nonverbal - gestures Verbal Expression Overall Verbal Expression: Impaired Initiation: Impaired Automatic Speech:  (unable) Level of Generative/Spontaneous Verbalization: Word Repetition: Impaired Level of Impairment: Word level Naming: Impairment Responsive: 0-25% accurate Confrontation: Impaired Common Objects: Unable to indentify Convergent: 0-24% accurate Divergent: 0-24% accurate Verbal Errors: Aware of errors Pragmatics: Impairment Impairments: Abnormal affect;Eye contact Interfering Components: Attention Effective Techniques: Melodic intonation;Articulatory cues;Phonemic cues Non-Verbal Means of Communication: Not applicable Written  Expression Dominant Hand: Right Written Expression: Not tested   Oral / Motor Oral Motor/Sensory Function Overall Oral Motor/Sensory Function: Impaired Labial ROM: Reduced right Labial Symmetry: Abnormal symmetry right Labial Strength: Reduced Labial Sensation: Reduced Lingual ROM: Reduced right Lingual Symmetry: Abnormal symmetry right Lingual Strength: Reduced Lingual Sensation: Reduced Facial ROM: Reduced right Facial Symmetry: Right droop Facial Strength: Reduced Facial Sensation: Reduced Velum: Within Functional Limits Mandible: Within Functional Limits Motor Speech Overall Motor Speech: Impaired Respiration: Impaired Phonation: Low vocal  intensity Articulation: Impaired Level of Impairment: Word Intelligibility: Intelligibility reduced Word: 0-24% accurate Motor Planning: Impaired Level of Impairment: Word Motor Speech Errors: Inconsistent   GO     Maryjo Rochester T 10/15/2013, 1:57 PM

## 2013-10-15 NOTE — Progress Notes (Signed)
    Subjective:  AFIB earlier. Now in NSR. PAC's noted. PAF     Objective:  Filed Vitals:   10/15/13 0422 10/15/13 0700 10/15/13 0810 10/15/13 0831  BP: 193/71 197/66 161/71   Pulse: 88  83   Temp: 97.4 F (36.3 C)  98.2 F (36.8 C)   TempSrc: Oral  Oral   Resp: 18  18   Height:      Weight:      SpO2: 93%  94% 95%    Intake/Output from previous day:  Intake/Output Summary (Last 24 hours) at 10/15/13 0959 Last data filed at 10/15/13 [id number]  Gross per 24 hour  Intake    323 ml  Output   2700 ml  Net  -2377 ml    Physical Exam: Physical exam: Well-developed well-nourished in no acute distress.  Skin is warm and dry.  HEENT is normal.  Neck is supple. No thyromegaly.  Chest rhonchi, poor air movement Bilat, +wheeze Cardiovascular exam is regular rate and rhythm.  Abdominal exam nontender or distended. No masses palpated. Extremities show no edema. neuro right facial droop; right hemparesis, Aphasia    Lab Results: Basic Metabolic Panel:  Recent Labs  62/13/801/09/28 0330 10/15/13 0820  NA 146 159*  K 4.9 4.0  CL 111 118*  CO2 23 22  GLUCOSE 138* 139*  BUN 42* 52*  CREATININE 1.99* 1.76*  CALCIUM 9.1 9.7   CBC:  Recent Labs  10/13/13 0330 10/15/13 0820  WBC 8.2 6.4  HGB 7.7* 8.2*  HCT 23.7* 26.1*  MCV 91.2 92.6  PLT 199 205   Cardiac Enzymes:  Recent Labs  10/12/13 1450  TROPONINI 2.36*     Assessment/Plan:  1. Atrial fibrillation: Paroxysmal. In sinus at present. Continue lopressor IV. She will need to be anticoagulated, likely by NOAC, when cleared by neuro (holding off for right now given large CVA and concern for hemorrhagic conversion). Also can not swallow pills at present time. Spoke with daughter who is frustrated at moment with entire situation. She requested social work and I will help with order. I can assume that several tough decisions need to be made and they are falling on the shoulders of the daughter, Lawson FiscalLori.   2. Elevated  troponin: No chest pain. Etiology uncertain. Possibly related to CVA. Given severity of CVA, would hold on further evaluation at this point. LV function normal on echo. Would plan nuclear study when she recovers from CVA for risk stratification.   3. Acute kidney failure: possibly exacerbated by IV lasix however CXR has appearance of pulmonary edema. Improved. 1.76 with lasix 40 IV BID. Will reduce to 20mg  IV BID. BMET in AM  4. Anemia: needs evaluation prior to anticoagulation; WU per primary service.  5. Dyspnea - steroids, wheeze.    Dynastee Brummell 10/15/2013, 9:59 AM

## 2013-10-15 NOTE — Progress Notes (Signed)
At 00:09, responded to bedside to check on patient's neuro status. Noted that HR was 79, NSR re-established with brief, non-sustained bursts of atrial fibrillation at higher rates. Confirmed with central telemetry; patient converted at approximately 23:52.  IV diltiazem infusing at 15mg /hr after one up-titration earlier this shift; reduced to 10mg /hr.  Vital signs remain stable, though BP still markedly elevated at 188/80. No neurological changes noted; see assessment data.  Will give PRN hydralazine for blood pressure, per orders. Continuing to monitor closely.

## 2013-10-15 NOTE — Progress Notes (Signed)
Stroke Team Progress Note  HISTORY 73 y.o. female who went to bed normal last evening. Was found on the floor by her daughter yesterday. Patient is unclear about the details of what happened. Patient was initially taken to [facility] where the patient reported that she thought she got dizzy and fell the day before. Was initially evaluated as being able to move all extremities but speech was altered and patient was having trouble getting her words out. While being further evaluated in the ED the patient was noted to have right sided weakness. SOC was contacted and it was felt that the patient would benefit from the additional services available to her at a Stroke Center. [facility] at that time. Initial NIHSS on presentation to [facility] of 18. While being evaluated at [facility] patient also noted to have an elevated troponin. She was given a bolus of Heparin. Also noted to be in atrial fibrillation. Not TPA candidate as outside TPA window as well as outside of interventional window.   SUBJECTIVE Daughter not at bedside.  OBJECTIVE Most recent Vital Signs: Filed Vitals:   10/15/13 0223 10/15/13 0422 10/15/13 0700 10/15/13 0810  BP: 180/69 193/71 197/66 161/71  Pulse:  88  83  Temp:  97.4 F (36.3 C)  98.2 F (36.8 C)  TempSrc:  Oral  Oral  Resp:  18  18  Height:      Weight:      SpO2:  93%  94%   CBG (last 3)   Recent Labs  10/15/13 0035 10/15/13 0423 10/15/13 0732  GLUCAP 140* 135* 125*    IV Fluid Intake:   . diltiazem (CARDIZEM) infusion 5 mg/hr (10/15/13 0214)   MEDICATIONS  . antiseptic oral rinse  15 mL Mouth Rinse BID  . aspirin  300 mg Rectal Daily  . budesonide  0.25 mg Nebulization BID  . cloNIDine  0.2 mg Transdermal Weekly  . famotidine (PEPCID) IV  20 mg Intravenous Q24H  . furosemide  40 mg Intravenous Q12H  . heparin  5,000 Units Subcutaneous 3 times per day  . insulin aspart  0-15 Units Subcutaneous 6 times per day  . levalbuterol   1.25 mg Nebulization Q6H   And  . ipratropium  0.5 mg Nebulization Q6H  . labetalol  20 mg Intravenous Once  . methylPREDNISolone (SOLU-MEDROL) injection  60 mg Intravenous Q6H  . metoprolol  5 mg Intravenous 4 times per day   PRN:  hydrALAZINE, labetalol, levalbuterol, metoprolol, senna-docusate  Diet:  NPO  Activity:   DVT Prophylaxis:  Heparin 5000 units sq tid, SCDs   CLINICALLY SIGNIFICANT STUDIES Basic Metabolic Panel:   Recent Labs Lab 10/12/13 0500 10/13/13 0330  NA 149* 146  K 4.2 4.9  CL 108 111  CO2 29 23  GLUCOSE 114* 138*  BUN 38* 42*  CREATININE 1.41* 1.99*  CALCIUM 8.8 9.1   Liver Function Tests:   Recent Labs Lab 10/11/13 1857  AST 53*  ALT 20  ALKPHOS 68  BILITOT 0.4  PROT 6.1  ALBUMIN 3.2*   CBC:   Recent Labs Lab 10/11/13 1857 10/13/13 0330  WBC 8.9 8.2  NEUTROABS 7.1  --   HGB 9.6* 7.7*  HCT 28.1* 23.7*  MCV 87.3 91.2  PLT 198 199   Coagulation:   Recent Labs Lab 10/11/13 1857  LABPROT 13.9  INR 1.09   Cardiac Enzymes:   Recent Labs Lab 10/11/13 1857 10/11/13 2206 10/12/13 0327 10/12/13 1450  CKTOTAL 710*  --   --   --  CKMB 18.8*  --   --   --   TROPONINI 4.15* 4.85* 4.45* 2.36*   Urinalysis: No results found for this basename: COLORURINE, APPERANCEUR, LABSPEC, PHURINE, GLUCOSEU, HGBUR, BILIRUBINUR, KETONESUR, PROTEINUR, UROBILINOGEN, NITRITE, LEUKOCYTESUR,  in the last 168 hours Lipid Panel    Component Value Date/Time   CHOL 177 10/12/2013 0500   TRIG 121 10/12/2013 0500   HDL 60 10/12/2013 0500   CHOLHDL 3.0 10/12/2013 0500   VLDL 24 10/12/2013 0500   LDLCALC 93 10/12/2013 0500   HgbA1C  Lab Results  Component Value Date   HGBA1C 5.6 10/12/2013    Urine Drug Screen:   No results found for this basename: labopia,  cocainscrnur,  labbenz,  amphetmu,  thcu,  labbarb    Alcohol Level: No results found for this basename: ETH,  in the last 168 hours  CT of the brain  10/12/2013   1. No acute intracranial pathology seen  on CT. 2. Diffuse small vessel ischemic microangiopathy again noted; scattered chronic lacunar infarcts within the basal ganglia bilaterally.     CT Angio Head 10/11/2013   1. No acute or subacute cortical infarct is evident. 2. Bilateral basal ganglia infarcts are age indeterminate. 3. 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 4. Segmental stenoses within the PCA branch vessels bilaterally and right greater than left MCA branch vessels. 5. Segmental stenoses within the pericallosal arteries, right greater than left.     MRI of the brain  10/12/2013    1. Acute nonhemorrhagic infarct involving the posterior left lentiform nucleus extending superiorly into the corona radiata. 2. Additional remote lacunar infarcts are present in the basal ganglia, brainstem, inferior cerebellum bilaterally. 3. Atrophy and diffuse white matter changes are present bilaterally including the area of acute infarct. This likely reflects the sequelae of chronic microvascular ischemia. 4. Minimal sinus disease.   2D Echocardiogram  Normal LV size and systolic function, EF 60-65%. Moderate LV hypertrophy. Normal RV size and systolic function. No significant valvular abnormalities.  Carotid Doppler  Findings suggest 60-79% right internal carotid artery stenosis, and 1-39% left internal carotid artery stenosis. Vertebral arteries are patent with antegrade flow.  CXR  10/11/2013    No acute cardiopulmonary disease.     EKG  A-fib with RVR and now in sinus rhythm.    Therapy Recommendations CIR  Physical Exam   Neurologic Examination:  Mental Status:  Alert, oriented, thought content appropriate. Speech fluent without evidence of aphasia but slurred. Able to follow simple commands without difficulty.  Cranial Nerves:  II: Discs flat bilaterally; Visual fields grossly normal, pupils equal, round, reactive to light and accommodation  III,IV, VI: ptosis not present, patient unable to go beyond  midline to the right  V,VII: right facial droop, facial light touch sensation decreased on the right  VIII: hearing normal bilaterally  IX,X: gag reflex reduced  XI: shoulder shrug decreased on the right  XII: midline tongue extension  Motor:  Right : Upper extremity 0/5 Left: Upper extremity 5/5  Lower extremity 0/5 Lower extremity 5/5  Tone and bulk:normal tone throughout; no atrophy noted  Sensory: Pinprick and light touch decreased on the right  Deep Tendon Reflexes: 2+ throughout with an absent left ankle jerk  Plantars:  Right: upgoing Left: mute  Cerebellar:  normal finger-to-nose and normal heel-to-shin testing on the left. Unable to perform on the right  Gait: Unable to test due to weakness  CV: pulses palpable throughout    ASSESSMENT Ms. Wendy Malone  Malone is a 73 y.o. female presenting with slurred speech, left eye preference and right hemiparesis as well as R sided neglect.  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 Current infarcts felt to be embolic secondary to atrial fibrillation. On no antithrombotics prior to admission. Now on aspirin suppository 300 mg daily for secondary stroke prevention. Patient with resultant with right hemiparesis, right-sided neurologic neglect, dysarthria, left gaze preference, dysphagia. Stroke work up completed.   Respiratory failure with acute distress this am d/t pulmonary edema  A- fib, back on cardizem  Elevated troponin, trended down to 4 from initial   6. Cardiology consulted. Given severity of stroke, will hold on further evaluation at this point. Will plan nuclear study when she recovers from stroke for risk stratification.  Normocytic anemia, per GI not convincing for iron deficiency, pt has refused GI w/u in past,  9.6 Malignant hypertension 213/92  Cigarette smoker  Right ICA stenosis 60-79%,  incidental finding  Acute kidney failure, creatinine 1.99  Respiratory distress   wheezing - xopenex ordered, BB given  Crackles in bases, concern for pulmonary edema  Dysphagia  failed bedside swallow eval yest  temporary means of feeding suggested  Daughter considering PEG  Hospital day # 4  TREATMENT/PLAN  Continue ASA suppository for secondary stroke prevention. Agree with cardiology recommendations for a NOAC. At this time, given risk for hemorrhagic transformation, as well as inability to swallow and anemia, will hold and follow.  Speech to follow swallow.  Recommend placing temporary panda until decision made r/t PEG  CIR vs SNF at discharge; palliative care remains a reasonable option if daughter desires.  DNR/DNI  Dr. Pearlean Brownie discussed diagnosis, prognosis,  treatment options and plan of care with daughter.   Annie Main, MSN, RN, ANVP-BC, ANP-BC, [facility] Pager: [phone] 10/15/2013 8:12 AM  I have personally obtained a history, examined the patient, evaluated imaging results, and formulated the assessment and plan of care. I agree with the above.  Delia Heady, MD

## 2013-10-15 NOTE — Progress Notes (Signed)
Speech Language Pathology Treatment: Dysphagia  Patient Details Name: Lake Bellsnn Nicodemus MRN: [id number] DOB: 05/30/1941 Today's Date: 10/15/2013 Time: [id number] SLP Time Calculation (min): 35 min  Assessment / Plan / Recommendation Clinical Impression  Patient up in recliner.  Pt. And daughter eager for pt. To reattempt po's.  Patient continues to exhibit significant oral apraxia, however, with max cues and increased time, pt. Was able to move purees and honey thick liquid boluses posteriorly and initiate a swallow.  Thin and nectar thick liquids appear to move too quickly and appeared (at b/s) to be aspirated (positive cough response).  Pt. And daughter are hopeful that the NG and PEG tubes can be avoided, however, feeding will be laborious and time consuming, and pt. May not meet her nutritional needs with po's alone.  It is recommended that pt. Be allowed at least one more day to attempt po's prior to feeding tube being placed.   HPI HPI: Lake Bellsnn Geng is a 73 y.o. female with HTN and GERD who [facility] with acute onset slurred speech, right sided hemiparesis and facial droop, found to have acute left brain CVA. MRI brain shows Acute nonhemorrhagic infarct involving the posterior left lentiform nucleus extending superiorly into the corona radiata. . Additional remote lacunar infarcts are present in the basal ganglia, brainstem, inferior cerebellum bilaterally.Initial NIHSS on presentation to [facility] of 18.    Pertinent Vitals Afebrile; LS diminished.  SLP Plan  Goals updated    Recommendations Diet recommendations: Dysphagia 1 (puree);Honey-thick liquid Liquids provided via: Teaspoon Medication Administration: Crushed with puree Supervision: Full supervision/cueing for compensatory strategies;Staff to assist with self feeding Compensations: Slow rate;Small sips/bites;Check for pocketing;Check for anterior loss Postural Changes and/or Swallow Maneuvers: Seated upright 90 degrees             General recommendations: Rehab consult Oral Care Recommendations: Oral care before and after PO;Staff/trained caregiver to provide oral care Follow up Recommendations: Inpatient Rehab Plan: Goals updated    GO     Maryjo RochesterWillis, Kyan Giannone T 10/15/2013, 1:19 PM

## 2013-10-15 NOTE — Progress Notes (Signed)
Patient ID: Wendy Malone  female  [id number]    DOB: 06/01/1941    DOA: 10/11/2013  PCP: No primary provider on file.  Brief history of present illness 73 years old female with PMH relevant for HTN and GERD. Presented with right hemiparesis and facial droop. Last seen normal the day prior. Out of window for TPA. Patient was alert awake and oriented and protecting her airway. She was hemodynamically stable however in Intermittent rapid A. Fib on amiodarone drip.  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 likely precipitated due to atrial fibrillation - Patient seen and examined this morning, alert with dysarthria, able to follow simple commands - Stroke service following, continue aspirin suppository for secondary stroke prevention - Cardiology also has been following, recommending new anticoagulation agents, currently on hold due to possibility of hemorrhagic transformation and inability to swallow - Stroke workup complete, evaluated by CIR  Uncontrolled hypertension - Placed on clonidine patch, on scheduled metoprolol, on IV labetalol and hydralazine as needed   Acute respiratory failure with hypoxia/distress with diffuse wheezing bilaterally, due to pulmonary edema, diastolic dysfunction - Patient had rapid response yesterday morning with acute respiratory distress, chest x-ray shows pulmonary edema, ordered BNP which was elevated at  29,115.0 - Placed on IV salmeterol, scheduled to Xopenex and Atrovent nebs, Pulmicort - continue IV Lasix. Per cardiology. - 2-D echo was done 3/1 which had shown EF of 60-65% with grade 1 diastolic dysfunction   Dysphagia - GI consult called for anemia workup and possible PEG tube placement. Discussed in detail with the patient's daughter at the bedside . This was discussed by neurology service as well. Prognosis for full recovery is poor and will need long-term placement. Patient has been seen by GI and not feel any workup for her anemia is needed at this time the recommended placement of a panda tube and if no improvement may need a PEG. Patient has been seen by speech therapy and patient has been started on a dysphagia 2 diet. We'll follow for now. Speech therapy following.    Atrial fibrillation  - Paroxysmal, currently in sinus rhythm, on IV Lopressor.  - Currently on aspirin PR, per cardiology, will need to be anticoagulated when cleared by neurology   - also workup for anemia prior to starting anticoagulation   Anemia - B12 folate normal, 42, ferritin 92 - Dr Isidoro Donningai discussed in detail with the patient's daughter, her mother did not want any invasive procedures/colonoscopy when her primary care physician had suggested In December 2014  Acute kidney failure  - Please note that the Lasix was started for acute respiratory distress/pulmonary edema/. Likely her acute renal dysfunction was precipitated by her n.p.o. status and malignant hypertension and decreased renal perfusion. Last creatinine 1.9 on 3/2  Renal function improving.  Hypernatremia Lasix decreased. Hold tonights dose lasix. Follow.  DVT Prophylaxis:  Code Status: DO NOT RESUSCITATE status   Family Communication: discussed in detail with patient's daughter at the bedside   Disposition: will  likely need skilled nursing facility vs CIR  Consultants:  Neurology    cardiology  Gastroenterology called  today  Procedures:  Multiple procedures, 2-D echo, carotid Dopplers, MRI/MRA   Antibiotics:  None     Subjective: The patient asking for water.  Objective: Weight change:   Intake/Output Summary (Last 24 hours) at 10/15/13 1041 Last data filed at 10/15/13 0659  Gross per 24 hour  Intake    323 ml  Output   2700 ml  Net  -2377 ml   Blood pressure 161/71, pulse 83, temperature 98.2 F (36.8 C), temperature source Oral, resp. rate 18, height 5' (1.524 m), weight 63.4 kg (139 lb 12.4 oz), SpO2 95.00%.  Physical Exam: General: Alert and awake CVS: S1-S2 clear, no murmur rubs or gallops Chest:  CTAB Abdomen: soft nontender, nondistended, normal bowel sounds  Extremities: no cyanosis, clubbing or edema noted bilaterally Neuro:  right hemiparesis  Lab Results: Basic Metabolic Panel:  Recent Labs Lab 10/13/13 0330 10/15/13 0820  NA 146 159*  K 4.9 4.0  CL 111 118*  CO2 23 22  GLUCOSE 138* 139*  BUN 42* 52*  CREATININE 1.99* 1.76*  CALCIUM 9.1 9.7   Liver Function Tests:  Recent Labs Lab 10/11/13 1857  AST 53*  ALT 20  ALKPHOS 68  BILITOT 0.4  PROT 6.1  ALBUMIN 3.2*   No results found for this basename: LIPASE, AMYLASE,  in the last 168 hours No results found for this basename: AMMONIA,  in the last 168 hours CBC:  Recent Labs Lab 10/11/13 1857 10/13/13 0330 10/15/13 0820  WBC 8.9 8.2 6.4  NEUTROABS 7.1  --   --   HGB 9.6* 7.7* 8.2*  HCT 28.1* 23.7* 26.1*  MCV 87.3 91.2 92.6  PLT 198 199 205   Cardiac Enzymes:  Recent Labs Lab 10/11/13 1857 10/11/13 2206 10/12/13 0327 10/12/13 1450  CKTOTAL 710*  --   --   --   CKMB 18.8*  --   --   --   TROPONINI 4.15* 4.85* 4.45* 2.36*   BNP: No components found with this basename: POCBNP,  CBG:  Recent Labs Lab 10/14/13 1639 10/14/13 2031 10/15/13 0035 10/15/13 0423 10/15/13 0732  GLUCAP 121* 134* 140* 135* 125*     Micro Results: Recent Results (from the past 240 hour(s))  MRSA PCR  SCREENING     Status: None   Collection Time    10/11/13  9:40 PM      Result Value Ref Range Status   MRSA by PCR NEGATIVE  NEGATIVE Final   Comment:            The GeneXpert MRSA Assay (FDA     approved for NASAL specimens     only), is one component of a     comprehensive MRSA colonization     surveillance program. It is not     intended to diagnose MRSA     infection nor to guide or     monitor treatment for     MRSA infections.    Studies/Results: Ct Angio Head W/cm &/or Wo Cm  10/11/2013   CLINICAL DATA:  Code stroke. Right-sided weakness and slurred speech.  EXAM: CT ANGIOGRAPHY HEAD  TECHNIQUE: Multidetector CT imaging of the head was performed using the standard protocol during bolus administration of intravenous contrast. Multiplanar CT image reconstructions and MIPs were obtained to evaluate the vascular anatomy.  CONTRAST:  50mL OMNIPAQUE IOHEXOL 350 MG/ML SOLN  COMPARISON:  None.  FINDINGS:  Gray-white differentiation is preserved the source images and postcontrast images without evidence for a focal cortical infarct. There are multiple lacunar infarcts within the basal ganglia bilaterally which are age-indeterminate. Extensive periventricular white matter hypoattenuation is present bilaterally.  No hemorrhage or mass lesion is evident.  The cervical internal carotid arteries are within normal limits bilaterally. Calcified and noncalcified plaque is present within the cavernous carotid arteries bilaterally. There is a moderate stenosis of the right cavernous carotid artery just proximal to the genu. Mild to moderate narrowing is present in the supra clinoid left internal carotid artery. Supra clinoid segments are normal bilaterally. The A1 and M1 segments are normal bilaterally. The anterior communicating artery is patent.  Segmental narrowing within the pericallosal arteries is worse on the right. There is segmental narrowing of distal MCA branch vessels bilaterally, right greater  than left.  The left vertebral artery is dominant. The right PICA origin is visualized and normal. The left AICA is dominant. The basilar artery is within normal limits. Both posterior cerebral arteries originate from the basilar tip. Segmental stenoses are present in the proximal PCA branch vessels bilaterally. The dural sinuses are patent.  Review of the MIP images confirms the above findings.  IMPRESSION: 1. No acute or subacute cortical infarct is evident. 2. Bilateral basal ganglia infarcts are age indeterminate. 3. 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 4. Segmental stenoses within the PCA branch vessels bilaterally and right greater than left MCA branch vessels. 5. Segmental stenoses within the pericallosal arteries, right greater than left.   Electronically Signed   By: Gennette Pac M.D.   On: 10/11/2013 21:31   Dg Chest 2 View  10/11/2013   CLINICAL DATA:  Stroke.  EXAM: CHEST  2 VIEW  COMPARISON:  None.  FINDINGS: Cardiac silhouette is mildly enlarged. Normal mediastinal and hilar contours.  There are prominent bronchovascular markings. Lungs are otherwise clear. No pleural effusion or pneumothorax.  The bony thorax is intact.  IMPRESSION: No acute cardiopulmonary disease.   Electronically Signed   By: Amie Portland M.D.   On: 10/11/2013 21:03   Ct Head Wo Contrast  10/12/2013   CLINICAL DATA:  Altered mental status.  Unable to speak.  EXAM: CT HEAD WITHOUT CONTRAST  TECHNIQUE: Contiguous axial images were obtained from the base of the skull through the vertex without intravenous contrast.  COMPARISON:  CTA of the head performed 10/11/2013  FINDINGS: There is no evidence of acute infarction, mass lesion, or intra- or extra-axial hemorrhage on CT.  Diffuse periventricular and subcortical white matter change likely reflects small vessel ischemic microangiopathy. Scattered chronic lacunar infarcts are seen within the basal ganglia bilaterally.  The posterior fossa,  including the cerebellum, brainstem and fourth ventricle, is within normal limits. The third and lateral ventricles are unremarkable in appearance. The cerebral hemispheres demonstrate grossly normal gray-white differentiation. No mass effect or midline shift is seen.  There is no evidence of fracture; visualized osseous structures are unremarkable in appearance. The visualized portions of the orbits are within normal limits. The paranasal sinuses and mastoid air cells are well-aerated. No significant soft tissue abnormalities are seen.  IMPRESSION: 1. No acute intracranial pathology seen on CT. 2. Diffuse small vessel ischemic microangiopathy again noted; scattered chronic lacunar infarcts within the basal ganglia bilaterally.   Electronically Signed   By: Roanna Raider M.D.   On: 10/12/2013 03:26   Mr Brain Wo Contrast  10/12/2013   CLINICAL DATA:  Slurred speech. Ischemic stroke. Right  hemiparesis and facial droop.  EXAM: MRI HEAD WITHOUT CONTRAST  TECHNIQUE: Multiplanar, multiecho pulse sequences of the brain and surrounding structures were obtained without intravenous contrast.  COMPARISON:  CT head without contrast 10/12/2013.  FINDINGS: Diffusion-weighted images demonstrate an acute nonhemorrhagic infarct involving the posterior left lentiform nucleus extending into the body of caudate and coronal radiata. The area measures 3.2 x 2.5 x 1.4 cm. The area is present within the region of diffuse white matter disease. Advanced atrophy and extensive white matter changes are present bilaterally. Remote lacunar infarcts are present in the brainstem and inferior cerebellum bilaterally. Remote lacunar infarcts are present in the basal ganglia bilaterally as well.  Flow is present in the major intracranial arteries. The globes and orbits are intact. Mild mucosal thickening is present in the sphenoid sinuses bilaterally. There is minimal mucosal thickening in the ethmoid air cells and inferior left frontal sinus. The  mastoid air cells are clear.  IMPRESSION: 1. Acute nonhemorrhagic infarct involving the posterior left lentiform nucleus extending superiorly into the corona radiata. 2. Additional remote lacunar infarcts are present in the basal ganglia, brainstem, inferior cerebellum bilaterally. 3. Atrophy and diffuse white matter changes are present bilaterally including the area of acute infarct. This likely reflects the sequelae of chronic microvascular ischemia. 4. Minimal sinus disease. These results were called by telephone at the time of interpretation on 10/12/2013 at 11:55 AM to Dr. Loretha Brasil, who verbally acknowledged these results.   Electronically Signed   By: Gennette Pac M.D.   On: 10/12/2013 11:58   Dg Chest Port 1 View  10/14/2013   CLINICAL DATA:  Respiratory distress.  EXAM: PORTABLE CHEST - 1 VIEW  COMPARISON:  There prior  FINDINGS: Cardiac silhouette remains mildly enlarged, mediastinal silhouette is nonsuspicious, calcified aortic knob. Diffuse interstitial prominence increased from prior examination with patchy bibasilar airspace opacities. Small right pleural effusion. No pneumothorax.  Patient is osteopenic. Left antecubital intravenous catheter in place. Multiple EKG lines overlie the patient and may obscure subtle underlying pathology.  IMPRESSION: Stable cardiomegaly with increasing interstitial prominence suggesting pulmonary edema, mild patchy airspace opacity lung bases may reflect confluent edema, atelectasis, less likely pneumonia with small right pleural effusion.   Electronically Signed   By: Awilda Metro   On: 10/14/2013 05:03    Medications: Scheduled Meds: . antiseptic oral rinse  15 mL Mouth Rinse BID  . aspirin  300 mg Rectal Daily  . budesonide  0.25 mg Nebulization BID  . cloNIDine  0.2 mg Transdermal Weekly  . famotidine (PEPCID) IV  20 mg Intravenous Q24H  . furosemide  20 mg Intravenous Q12H  . heparin  5,000 Units Subcutaneous 3 times per day  . insulin aspart  0-15  Units Subcutaneous 6 times per day  . levalbuterol  1.25 mg Nebulization Q6H   And  . ipratropium  0.5 mg Nebulization Q6H  . labetalol  20 mg Intravenous Once  . methylPREDNISolone (SOLU-MEDROL) injection  60 mg Intravenous Q6H  . metoprolol  5 mg Intravenous 4 times per day      LOS: 4 days   [facility] M.D. [facility] 10/15/2013, 10:41 AM Pager: [phone]  If 7PM-7AM, please contact night-coverage [email or web address] Password [email or web address]

## 2013-10-15 NOTE — Progress Notes (Signed)
Rehab admissions - Patient now on cardizem drip due to atrial fib.  Noted plans for panda tube feedings to begin today.  Not medically ready for inpatient rehab yet.  Call me for questions.  #[phone]

## 2013-10-16 ENCOUNTER — Inpatient Hospital Stay (HOSPITAL_COMMUNITY): Payer: Medicare Other

## 2013-10-16 DIAGNOSIS — I69391 Dysphagia following cerebral infarction: Secondary | ICD-10-CM

## 2013-10-16 DIAGNOSIS — I5031 Acute diastolic (congestive) heart failure: Secondary | ICD-10-CM | POA: Diagnosis present

## 2013-10-16 DIAGNOSIS — D649 Anemia, unspecified: Secondary | ICD-10-CM | POA: Diagnosis present

## 2013-10-16 DIAGNOSIS — N289 Disorder of kidney and ureter, unspecified: Secondary | ICD-10-CM | POA: Diagnosis not present

## 2013-10-16 LAB — GLUCOSE, CAPILLARY
GLUCOSE-CAPILLARY: 155 mg/dL — AB (ref 70–99)
Glucose-Capillary: 123 mg/dL — ABNORMAL HIGH (ref 70–99)
Glucose-Capillary: 138 mg/dL — ABNORMAL HIGH (ref 70–99)
Glucose-Capillary: 142 mg/dL — ABNORMAL HIGH (ref 70–99)
Glucose-Capillary: 147 mg/dL — ABNORMAL HIGH (ref 70–99)
Glucose-Capillary: 188 mg/dL — ABNORMAL HIGH (ref 70–99)

## 2013-10-16 LAB — BASIC METABOLIC PANEL
BUN: 66 mg/dL — AB (ref 6–23)
CO2: 23 mEq/L (ref 19–32)
CREATININE: 2.03 mg/dL — AB (ref 0.50–1.10)
Calcium: 9.6 mg/dL (ref 8.4–10.5)
Chloride: 119 mEq/L — ABNORMAL HIGH (ref 96–112)
GFR calc Af Amer: 27 mL/min — ABNORMAL LOW (ref >90)
GFR, EST NON AFRICAN AMERICAN: 24 mL/min — AB (ref >90)
GLUCOSE: 139 mg/dL — AB (ref 70–99)
Potassium: 3.8 mEq/L (ref 3.7–5.3)
Sodium: 160 mEq/L — ABNORMAL HIGH (ref 137–147)

## 2013-10-16 LAB — CBC
HCT: 26.9 % — ABNORMAL LOW (ref 36.0–46.0)
HEMOGLOBIN: 8.4 g/dL — AB (ref 12.0–15.0)
MCH: 29.1 pg (ref 26.0–34.0)
MCHC: 31.2 g/dL (ref 30.0–36.0)
MCV: 93.1 fL (ref 78.0–100.0)
Platelets: 211 10*3/uL (ref 150–400)
RBC: 2.89 MIL/uL — ABNORMAL LOW (ref 3.87–5.11)
RDW: 17.4 % — AB (ref 11.5–15.5)
WBC: 5.7 10*3/uL (ref 4.0–10.5)

## 2013-10-16 MED ORDER — DEXTROSE 5 % IV SOLN: INTRAVENOUS | Status: DC

## 2013-10-16 MED ORDER — AMIODARONE HCL IN DEXTROSE 360-4.14 MG/200ML-% IV SOLN
60.0000 mg/h | INTRAVENOUS | Status: DC
  Filled 2013-10-16: qty 200

## 2013-10-16 MED ORDER — AMIODARONE LOAD VIA INFUSION
150.0000 mg | Freq: Once | INTRAVENOUS | Status: DC
  Filled 2013-10-16: qty 83.34

## 2013-10-16 MED ORDER — DEXTROSE 5 % IV SOLN
INTRAVENOUS | Status: DC
  Filled 2013-10-16: qty 1000

## 2013-10-16 MED ORDER — METOPROLOL TARTRATE 1 MG/ML IV SOLN
5.0000 mg | INTRAVENOUS | Status: DC
  Administered 2013-10-16 – 2013-10-17 (×3): 5 mg via INTRAVENOUS
  Filled 2013-10-16 (×7): qty 5

## 2013-10-16 MED ORDER — AMIODARONE HCL IN DEXTROSE 360-4.14 MG/200ML-% IV SOLN
30.0000 mg/h | INTRAVENOUS | Status: DC
Start: 2013-10-16 — End: 2013-10-16
  Filled 2013-10-16: qty 200

## 2013-10-16 MED ORDER — DEXTROSE 5 % IV SOLN
INTRAVENOUS | Status: DC
  Administered 2013-10-16 – 2013-10-17 (×2): via INTRAVENOUS

## 2013-10-16 MED ORDER — OSMOLITE 1.2 CAL PO LIQD
1000.0000 mL | ORAL | Status: DC
  Administered 2013-10-18: 50 mL
  Administered 2013-10-19 – 2013-10-20 (×2): 1000 mL
  Filled 2013-10-16 (×7): qty 1000

## 2013-10-16 NOTE — Progress Notes (Signed)
After discussion between pt daughter and MD it was decided that NG tube will be placed.  Tube feedings will be given continuously.  If pt pulls tube out, RN will NOT replace tube and options for placing PEG tube will then be considered.

## 2013-10-16 NOTE — Progress Notes (Signed)
Patient ID: Wendy Malone  female  SAY:[id number]    DOB: [birth year]    DOA: 10/11/2013  PCP: No primary provider on file.  Brief history of present illness 73 years old female with PMH relevant for HTN and GERD. Presented with right hemiparesis and facial droop. Last seen normal the day prior. Out of window for TPA. Patient was alert awake and oriented and protecting her airway. She was hemodynamically stable however in Intermittent rapid A. Fib on amiodarone drip.  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 likely precipitated due to atrial fibrillation - Stroke service following, continue aspirin suppository for secondary stroke prevention - Cardiology also has been following, recommending new anticoagulation agents, currently on hold due to possibility of hemorrhagic transformation and inability to swallow - Stroke workup complete, evaluated by CIR, not still medically ready for transfer to rehab  Uncontrolled hypertension - Placed on clonidine patch, on scheduled metoprolol, on IV labetalol and hydralazine as needed   Acute respiratory failure with hypoxia/distress with diffuse wheezing bilaterally, due to pulmonary edema, diastolic dysfunction - Patient had rapid response on 3/3 morning with acute respiratory distress, chest x-ray shows pulmonary edema, ordered BNP which was elevated at 29,115.0 - Placed on IV salmeterol, scheduled to Xopenex and Atrovent  nebs, Pulmicort - Dose of lasix reduced to 20 mg IV BID per cardiology. - 2-D echo was done 3/1 which had shown EF of 60-65% with grade 1 diastolic dysfunction   Dysphagia - GI consult called for anemia workup and possible PEG tube placement. Discussed in detail with the patient's daughter at the bedside . This was discussed by neurology service as well. Prognosis for full recovery is poor and will need long-term placement. Patient has been seen by GI and not feel any workup for her anemia is needed at this time the recommended placement of a panda tube, patient started on dysphagia diet, and did not do well with the diet, will need panda tube for feedings.    Atrial fibrillation  - Paroxysmal, currently in sinus rhythm, on IV Lopressor.  - Currently on aspirin PR, per cardiology, will need to be anticoagulated when cleared by neurology   - also workup for anemia prior to starting anticoagulation   Anemia - B12 folate normal, 42, ferritin 92 - Dr Isidoro Donningai discussed in detail with the patient's daughter, her mother did not want any invasive procedures/colonoscopy when her primary care physician had suggested In December 2014  Acute kidney failure  - Please note that the Lasix was started for acute respiratory distress/pulmonary edema/. Likely her acute renal dysfunction was precipitated by her n.p.o. status and malignant hypertension and decreased renal perfusion. Last creatinine 1.9 on 3/2  Renal function getting worse, so the Lasix dose has been reduced to 20 mg IV q 12 hrs.  Hypernatremia Will hold the lasix at this time. Will start D5 w at 50 ml/hr.  DVT Prophylaxis:  Code Status: DO NOT RESUSCITATE status   Family Communication: discussed in detail with patient's  daughter at the bedside   Disposition: will likely need skilled nursing facility vs CIR  Consultants:  Neurology    cardiology  Gastroenterology called today  Procedures:  Multiple procedures, 2-D echo, carotid  Dopplers, MRI/MRA   Antibiotics:  None     Subjective: The patient seen and examined, alert, has dysarthria.  Objective: Weight change:   Intake/Output Summary (Last 24 hours) at 10/16/13 1236 Last data filed at 10/16/13 0521  Gross per 24 hour  Intake 279.08 ml  Output   1700 ml  Net -1420.92 ml   Blood pressure 186/64, pulse 76, temperature 99.6 F (37.6 C), temperature source Axillary, resp. rate 18, height 5' (1.524 m), weight 63.4 kg (139 lb 12.4 oz), SpO2 94.00%.  Physical Exam: Physical Exam: Head: Normocephalic, atraumatic.  Eyes: No signs of jaundice, EOMI Nose: Mucous membranes dry.  Throat: Oropharynx nonerythematous, no exudate appreciated.  Neck: supple,No deformities, masses, or tenderness noted. Lungs: Normal respiratory effort. B/L Clear to auscultation, no crackles or wheezes.  Heart: Regular RR. S1 and S2 normal  Abdomen: BS normoactive. Soft, Nondistended, non-tender.  Extremities: No pretibial edema, no erythema   Lab Results: Basic Metabolic Panel:  Recent Labs Lab 10/15/13 0820 10/16/13 0335  NA 159* 160*  K 4.0 3.8  CL 118* 119*  CO2 22 23  GLUCOSE 139* 139*  BUN 52* 66*  CREATININE 1.76* 2.03*  CALCIUM 9.7 9.6   Liver Function Tests:  Recent Labs Lab 10/11/13 1857  AST 53*  ALT 20  ALKPHOS 68  BILITOT 0.4  PROT 6.1  ALBUMIN 3.2*   No results found for this basename: LIPASE, AMYLASE,  in the last 168 hours No results found for this basename: AMMONIA,  in the last 168 hours CBC:  Recent Labs Lab 10/11/13 1857  10/15/13 0820 10/16/13 0335  WBC 8.9  < > 6.4 5.7  NEUTROABS 7.1  --   --   --   HGB 9.6*  < > 8.2* 8.4*  HCT 28.1*  < > 26.1* 26.9*  MCV 87.3  < > 92.6 93.1  PLT 198  < > 205 211  < > = values in this interval not displayed. Cardiac Enzymes:  Recent Labs Lab 10/11/13 1857 10/11/13 2206 10/12/13 0327 10/12/13 1450  CKTOTAL 710*  --   --   --   CKMB 18.8*  --   --   --   TROPONINI 4.15* 4.85* 4.45*  2.36*   BNP: No components found with this basename: POCBNP,  CBG:  Recent Labs Lab 10/15/13 2058 10/16/13 0058 10/16/13 0401 10/16/13 0756 10/16/13 1144  GLUCAP 220* 123* 142* 138* 147*     Micro Results: Recent Results (from the past 240 hour(s))  MRSA PCR SCREENING     Status: None   Collection Time    10/11/13  9:40 PM      Result Value Ref Range Status   MRSA by PCR NEGATIVE  NEGATIVE Final   Comment:            The GeneXpert MRSA Assay (FDA     approved for NASAL specimens     only), is one component of a     comprehensive MRSA colonization     surveillance program. It is not     intended to diagnose MRSA     infection nor to guide or     monitor treatment for     MRSA infections.    Studies/Results: Ct Angio Head W/cm &/or Wo Cm  10/11/2013  CLINICAL DATA:  Code stroke. Right-sided weakness and slurred speech.  EXAM: CT ANGIOGRAPHY HEAD  TECHNIQUE: Multidetector CT imaging of the head was performed using the standard protocol during bolus administration of intravenous contrast. Multiplanar CT image reconstructions and MIPs were obtained to evaluate the vascular anatomy.  CONTRAST:  50mL OMNIPAQUE IOHEXOL 350 MG/ML SOLN  COMPARISON:  None.  FINDINGS: Gray-white differentiation is preserved the source images and postcontrast images without evidence for a focal cortical infarct. There are multiple lacunar infarcts within the basal ganglia bilaterally which are age-indeterminate. Extensive periventricular white matter hypoattenuation is present bilaterally.  No hemorrhage or mass lesion is evident.  The cervical internal carotid arteries are within normal limits bilaterally. Calcified and noncalcified plaque is present within the cavernous carotid arteries bilaterally. There is a moderate stenosis of the right cavernous carotid artery just proximal to the genu. Mild to moderate narrowing is present in the supra clinoid left internal carotid artery. Supra clinoid segments are  normal bilaterally. The A1 and M1 segments are normal bilaterally. The anterior communicating artery is patent.  Segmental narrowing within the pericallosal arteries is worse on the right. There is segmental narrowing of distal MCA branch vessels bilaterally, right greater than left.  The left vertebral artery is dominant. The right PICA origin is visualized and normal. The left AICA is dominant. The basilar artery is within normal limits. Both posterior cerebral arteries originate from the basilar tip. Segmental stenoses are present in the proximal PCA branch vessels bilaterally. The dural sinuses are patent.  Review of the MIP images confirms the above findings.  IMPRESSION: 1. No acute or subacute cortical infarct is evident. 2. Bilateral basal ganglia infarcts are age indeterminate. 3. 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 4. Segmental stenoses within the PCA branch vessels bilaterally and right greater than left MCA branch vessels. 5. Segmental stenoses within the pericallosal arteries, right greater than left.   Electronically Signed   By: Gennette Pac M.D.   On: 10/11/2013 21:31   Dg Chest 2 View  10/11/2013   CLINICAL DATA:  Stroke.  EXAM: CHEST  2 VIEW  COMPARISON:  None.  FINDINGS: Cardiac silhouette is mildly enlarged. Normal mediastinal and hilar contours.  There are prominent bronchovascular markings. Lungs are otherwise clear. No pleural effusion or pneumothorax.  The bony thorax is intact.  IMPRESSION: No acute cardiopulmonary disease.   Electronically Signed   By: Amie Portland M.D.   On: 10/11/2013 21:03   Ct Head Wo Contrast  10/12/2013   CLINICAL DATA:  Altered mental status.  Unable to speak.  EXAM: CT HEAD WITHOUT CONTRAST  TECHNIQUE: Contiguous axial images were obtained from the base of the skull through the vertex without intravenous contrast.  COMPARISON:  CTA of the head performed 10/11/2013  FINDINGS: There is no evidence of acute infarction,  mass lesion, or intra- or extra-axial hemorrhage on CT.  Diffuse periventricular and subcortical white matter change likely reflects small vessel ischemic microangiopathy. Scattered chronic lacunar infarcts are seen within the basal ganglia bilaterally.  The posterior fossa, including the cerebellum, brainstem and fourth ventricle, is within normal limits. The third and lateral ventricles are unremarkable in appearance. The cerebral hemispheres demonstrate grossly normal gray-white differentiation. No mass effect or midline shift is seen.  There is no evidence of fracture; visualized osseous structures are unremarkable in appearance. The visualized portions of the orbits are within normal limits. The paranasal sinuses and mastoid air cells are well-aerated. No significant soft tissue abnormalities  are seen.  IMPRESSION: 1. No acute intracranial pathology seen on CT. 2. Diffuse small vessel ischemic microangiopathy again noted; scattered chronic lacunar infarcts within the basal ganglia bilaterally.   Electronically Signed   By: Roanna Raider M.D.   On: 10/12/2013 03:26   Mr Brain Wo Contrast  10/12/2013   CLINICAL DATA:  Slurred speech. Ischemic stroke. Right hemiparesis and facial droop.  EXAM: MRI HEAD WITHOUT CONTRAST  TECHNIQUE: Multiplanar, multiecho pulse sequences of the brain and surrounding structures were obtained without intravenous contrast.  COMPARISON:  CT head without contrast 10/12/2013.  FINDINGS: Diffusion-weighted images demonstrate an acute nonhemorrhagic infarct involving the posterior left lentiform nucleus extending into the body of caudate and coronal radiata. The area measures 3.2 x 2.5 x 1.4 cm. The area is present within the region of diffuse white matter disease. Advanced atrophy and extensive white matter changes are present bilaterally. Remote lacunar infarcts are present in the brainstem and inferior cerebellum bilaterally. Remote lacunar infarcts are present in the basal ganglia  bilaterally as well.  Flow is present in the major intracranial arteries. The globes and orbits are intact. Mild mucosal thickening is present in the sphenoid sinuses bilaterally. There is minimal mucosal thickening in the ethmoid air cells and inferior left frontal sinus. The mastoid air cells are clear.  IMPRESSION: 1. Acute nonhemorrhagic infarct involving the posterior left lentiform nucleus extending superiorly into the corona radiata. 2. Additional remote lacunar infarcts are present in the basal ganglia, brainstem, inferior cerebellum bilaterally. 3. Atrophy and diffuse white matter changes are present bilaterally including the area of acute infarct. This likely reflects the sequelae of chronic microvascular ischemia. 4. Minimal sinus disease. These results were called by telephone at the time of interpretation on 10/12/2013 at 11:55 AM to Dr. Loretha Brasil, who verbally acknowledged these results.   Electronically Signed   By: Gennette Pac M.D.   On: 10/12/2013 11:58   Dg Chest Port 1 View  10/14/2013   CLINICAL DATA:  Respiratory distress.  EXAM: PORTABLE CHEST - 1 VIEW  COMPARISON:  There prior  FINDINGS: Cardiac silhouette remains mildly enlarged, mediastinal silhouette is nonsuspicious, calcified aortic knob. Diffuse interstitial prominence increased from prior examination with patchy bibasilar airspace opacities. Small right pleural effusion. No pneumothorax.  Patient is osteopenic. Left antecubital intravenous catheter in place. Multiple EKG lines overlie the patient and may obscure subtle underlying pathology.  IMPRESSION: Stable cardiomegaly with increasing interstitial prominence suggesting pulmonary edema, mild patchy airspace opacity lung bases may reflect confluent edema, atelectasis, less likely pneumonia with small right pleural effusion.   Electronically Signed   By: Awilda Metro   On: 10/14/2013 05:03    Medications: Scheduled Meds: . antiseptic oral rinse  15 mL Mouth Rinse BID  .  aspirin  300 mg Rectal Daily  . cloNIDine  0.2 mg Transdermal Weekly  . famotidine  20 mg Oral Daily  . feeding supplement (ENSURE)  1 Container Oral BID PC  . furosemide  20 mg Intravenous BID  . heparin  5,000 Units Subcutaneous 3 times per day  . insulin aspart  0-15 Units Subcutaneous 6 times per day  . methylPREDNISolone (SOLU-MEDROL) injection  60 mg Intravenous Q6H  . metoprolol  5 mg Intravenous 4 times per day      LOS: 5 days   [facility] M.D. [facility] 10/16/2013, 12:36 PM Pager: [phone]  If 7PM-7AM, please contact night-coverage [email or web address] Password [email or web address]

## 2013-10-16 NOTE — Progress Notes (Signed)
Stroke Team Progress Note  HISTORY 73 y.o. female who went to bed normal last evening. Was found on the floor by her daughter yesterday. Patient is unclear about the details of what happened. Patient was initially taken to [facility] where the patient reported that she thought she got dizzy and fell the day before. Was initially evaluated as being able to move all extremities but speech was altered and patient was having trouble getting her words out. While being further evaluated in the ED the patient was noted to have right sided weakness. SOC was contacted and it was felt that the patient would benefit from the additional services available to her at a Stroke Center. Patient was transferred to [facility] at that time. Initial NIHSS on presentation to [facility] of 18. While being evaluated at [facility] patient also noted to have an elevated troponin. She was given a bolus of Heparin. Also noted to be in atrial fibrillation. Not TPA candidate as outside TPA window as well as outside of interventional window.   SUBJECTIVE Daughter at bedside.  OBJECTIVE Most recent Vital Signs: Filed Vitals:   10/16/13 0031 10/16/13 0100 10/16/13 0441 10/16/13 0800  BP: 115/97 135/118 181/66 155/89  Pulse: 100 124 72 96  Temp: 99.1 F (37.3 C)  98.2 F (36.8 C) 100.5 F (38.1 C)  TempSrc: Axillary   Oral  Resp: 18  18 18   Height:      Weight:      SpO2: 96%  94% 94%   CBG (last 3)   Recent Labs  10/16/13 0058 10/16/13 0401 10/16/13 0756  GLUCAP 123* 142* 138*    IV Fluid Intake:   . dextrose 50 mL/hr at 10/16/13 0930  . diltiazem (CARDIZEM) infusion 10 mg/hr (10/16/13 0419)   MEDICATIONS  . antiseptic oral rinse  15 mL Mouth Rinse BID  . aspirin  300 mg Rectal Daily  . cloNIDine  0.2 mg Transdermal Weekly  . famotidine  20 mg Oral Daily  . feeding supplement (ENSURE)  1 Container Oral BID PC  . furosemide  20 mg Intravenous BID  . heparin  5,000 Units Subcutaneous 3 times per day  . insulin  aspart  0-15 Units Subcutaneous 6 times per day  . methylPREDNISolone (SOLU-MEDROL) injection  60 mg Intravenous Q6H  . metoprolol  5 mg Intravenous 4 times per day   PRN:  hydrALAZINE, ipratropium, labetalol, levalbuterol, levalbuterol, metoprolol, RESOURCE THICKENUP CLEAR, senna-docusate  Diet:  Dysphagia 1 honey thick -> now NPO Activity:   DVT Prophylaxis:  Heparin 5000 units sq tid, SCDs   CLINICALLY SIGNIFICANT STUDIES Basic Metabolic Panel:   Recent Labs Lab 10/15/13 0820 10/16/13 0335  NA 159* 160*  K 4.0 3.8  CL 118* 119*  CO2 22 23  GLUCOSE 139* 139*  BUN 52* 66*  CREATININE 1.76* 2.03*  CALCIUM 9.7 9.6   Liver Function Tests:   Recent Labs Lab 10/11/13 1857  AST 53*  ALT 20  ALKPHOS 68  BILITOT 0.4  PROT 6.1  ALBUMIN 3.2*   CBC:   Recent Labs Lab 10/11/13 1857  10/15/13 0820 10/16/13 0335  WBC 8.9  < > 6.4 5.7  NEUTROABS 7.1  --   --   --   HGB 9.6*  < > 8.2* 8.4*  HCT 28.1*  < > 26.1* 26.9*  MCV 87.3  < > 92.6 93.1  PLT 198  < > 205 211  < > = values in this interval not displayed. Coagulation:   Recent Labs  Lab 10/11/13 1857  LABPROT 13.9  INR 1.09   Cardiac Enzymes:   Recent Labs Lab 10/11/13 1857 10/11/13 2206 10/12/13 0327 10/12/13 1450  CKTOTAL 710*  --   --   --   CKMB 18.8*  --   --   --   TROPONINI 4.15* 4.85* 4.45* 2.36*   Urinalysis: No results found for this basename: COLORURINE, APPERANCEUR, LABSPEC, PHURINE, GLUCOSEU, HGBUR, BILIRUBINUR, KETONESUR, PROTEINUR, UROBILINOGEN, NITRITE, LEUKOCYTESUR,  in the last 168 hours Lipid Panel    Component Value Date/Time   CHOL 177 10/12/2013 0500   TRIG 121 10/12/2013 0500   HDL 60 10/12/2013 0500   CHOLHDL 3.0 10/12/2013 0500   VLDL 24 10/12/2013 0500   LDLCALC 93 10/12/2013 0500   HgbA1C  Lab Results  Component Value Date   HGBA1C 5.6 10/12/2013    Urine Drug Screen:   No results found for this basename: labopia,  cocainscrnur,  labbenz,  amphetmu,  thcu,  labbarb    Alcohol  Level: No results found for this basename: ETH,  in the last 168 hours  CT of the brain  10/12/2013   1. No acute intracranial pathology seen on CT. 2. Diffuse small vessel ischemic microangiopathy again noted; scattered chronic lacunar infarcts within the basal ganglia bilaterally.     CT Angio Head 10/11/2013   1. No acute or subacute cortical infarct is evident. 2. Bilateral basal ganglia infarcts are age indeterminate. 3. 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 4. Segmental stenoses within the PCA branch vessels bilaterally and right greater than left MCA branch vessels. 5. Segmental stenoses within the pericallosal arteries, right greater than left.     MRI of the brain  10/12/2013    1. Acute nonhemorrhagic infarct involving the posterior left lentiform nucleus extending superiorly into the corona radiata. 2. Additional remote lacunar infarcts are present in the basal ganglia, brainstem, inferior cerebellum bilaterally. 3. Atrophy and diffuse white matter changes are present bilaterally including the area of acute infarct. This likely reflects the sequelae of chronic microvascular ischemia. 4. Minimal sinus disease.   2D Echocardiogram  Normal LV size and systolic function, EF 60-65%. Moderate LV hypertrophy. Normal RV size and systolic function. No significant valvular abnormalities.  Carotid Doppler  Findings suggest 60-79% right internal carotid artery stenosis, and 1-39% left internal carotid artery stenosis. Vertebral arteries are patent with antegrade flow.  CXR  10/11/2013    No acute cardiopulmonary disease.     EKG  A-fib with RVR and now in sinus rhythm.    Therapy Recommendations CIR  Physical Exam   Neurologic Examination:  Mental Status:  Alert, oriented, thought content appropriate. Speech fluent without evidence of aphasia but slurred. Able to follow simple commands without difficulty.  Cranial Nerves:  II: Discs flat bilaterally; Visual fields  grossly normal, pupils equal, round, reactive to light and accommodation  III,IV, VI: ptosis not present, patient unable to go beyond midline to the right  V,VII: right facial droop, facial light touch sensation decreased on the right  VIII: hearing normal bilaterally  IX,X: gag reflex reduced  XI: shoulder shrug decreased on the right  XII: midline tongue extension  Motor:  Right : Upper extremity 0/5 Left: Upper extremity 5/5  Lower extremity 0/5 Lower extremity 5/5  Tone and bulk:normal tone throughout; no atrophy noted  Sensory: Pinprick and light touch decreased on the right  Deep Tendon Reflexes: 2+ throughout with an absent left ankle jerk  Plantars:  Right: upgoing  Left: mute  Cerebellar:  normal finger-to-nose and normal heel-to-shin testing on the left. Unable to perform on the right  Gait: Unable to test due to weakness  CV: pulses palpable throughout    ASSESSMENT Wendy Malone is a 73 y.o. female presenting with slurred speech, left eye preference and right hemiparesis as well as R sided neglect.  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 Current infarcts felt to be embolic secondary to atrial fibrillation. On no antithrombotics prior to admission. Now on aspirin suppository 300 mg daily for secondary stroke prevention. Patient with resultant with right hemiparesis, right-sided neurologic neglect, dysarthria, left gaze preference, dysphagia. Stroke work up completed.   A- fib, back on cardizem  Respiratory distress  D/t pulmonary edema, diastolic dysfunction  Elevated troponin, trended down to 4 from initial   6  Cardiology consulted.   Given severity of stroke, will hold on further evaluation at this point. plan nuclear study when she recovers from stroke for risk stratification.  Normocytic anemia, per GI not convincing for  iron deficiency, pt has refused GI w/u in past,  9.6  Acute kidney failure, creatinine 1.99   Malignant hypertension 213/92, difficult to control  Cigarette smoker  Right ICA stenosis 60-79%, incidental finding  Dysphagia  failed initial bedside swallow, progressed to D1, though tenuous. Back to NPO. Panda planned for feedings  Hospital day # 5  TREATMENT/PLAN  Continue ASA suppository for secondary stroke prevention. Agree with cardiology recommendations for a NOAC. At this time, given risk for hemorrhagic transformation, as well as anemia, would not start at this time. From neuro perspective, should be ok for NOAC in 1 week. Will defer starting to attending given anemia.  Agree with plans for PANDA. ST to follow swallow. They can continue to follow on rehab  CIR continuing to follow for admission.   DNR/DNI  Nothing further to add from the stroke perspecitve  Patient has a 10-15% risk of having another stroke over the next year, the highest risk is within 2 weeks of the most recent stroke/TIA (risk of having a stroke following a stroke or TIA is the same).  Ongoing risk factor control by Primary Care Physician  Stroke Service will sign off. Please call should any needs arise.  Follow up with Dr. Pearlean Brownie, Stroke Clinic, in 2 months.  Dr. Pearlean Brownie discussed diagnosis, prognosis,  treatment options and plan of care with daughter.   Annie Main, MSN, RN, ANVP-BC, ANP-BC, GNP-BC [facility] Pager: [phone] 10/16/2013 10:00 AM  I have personally obtained a history, examined the patient, evaluated imaging results, and formulated the assessment and plan of care. I agree with the above. Delia Heady, MD

## 2013-10-16 NOTE — Progress Notes (Signed)
PT Cancellation Note  Patient Details Name: Wendy Malone MRN: [id number] DOB: [birth year]   Cancelled Treatment:    Reason Eval/Treat Not Completed: Medical issues which prohibited therapy.  Pt is in A-fib HR 140s supine in the bed resting.  RN made aware.  Spoke with daughter about why we are not coming in right now for therapy.  Daughter understands, MD coming in to speak with daughter/pt.  PT to check back later if able.  Thanks,   Rollene Rotundaebecca B. Rhys Lichty, PT, DPT [phone]   10/16/2013, 1:43 PM

## 2013-10-16 NOTE — Progress Notes (Signed)
NUTRITION FOLLOW UP  Intervention:    Initiate TF via NGT (once placed by RN) with Osmolite 1.2 at 20 ml/h, increase by 10 ml every 4 hours to goal rate of 55 ml/h to provide 1584 kcals, 73 gm protein, 1082 ml free water daily.  Nutrition Dx:   Inadequate oral intake related to recent stroke and dysphagia as evidenced by NPO status. Ongoing.  Goal:   Intake to meet >90% of estimated nutrition needs.  Monitor:   TF tolerance/adequacy, weight trend, labs, diet advancement/PO intake.  Assessment:   73 year old female with PMH relevant for HTN and GERD. Presented to the ED on 2/28 with right hemiparesis and facial droop. 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 Current infarcts felt to be embolic secondary to atrial fibrillation.   Patient is now NPO. Previously on Dysphagia 1 diet with honey thick liquids. Per RN, patient only consumed a few bites of lunch today. Intake remains minimal. RN plans to place NGT for feedings this afternoon. She suspects patient will pull it out due to ongoing agitation. Potential for PEG placement. RD to order TF to start this evening if patient able to keep NGT in place.  Height: Ht Readings from Last 1 Encounters:  10/11/13 5' (1.524 m)    Weight Status:   Wt Readings from Last 1 Encounters:  10/11/13 139 lb 12.4 oz (63.4 kg)    Re-estimated needs:  Kcal: 1450-1650  Protein: 70-85 grams  Fluid: 1.6 L/day  Skin: no wounds  Diet Order: NPO   Intake/Output Summary (Last 24 hours) at 10/16/13 1512 Last data filed at 10/16/13 0521  Gross per 24 hour  Intake 279.08 ml  Output   1700 ml  Net -1420.92 ml    Last BM: 3/1   Labs:   Recent Labs Lab 10/13/13 0330 10/15/13 0820 10/16/13 0335  NA 146 159* 160*  K 4.9 4.0 3.8  CL 111 118* 119*  CO2 23 22 23   BUN 42* 52* 66*  CREATININE 1.99* 1.76*  2.03*  CALCIUM 9.1 9.7 9.6  GLUCOSE 138* 139* 139*    CBG (last 3)   Recent Labs  10/16/13 0401 10/16/13 0756 10/16/13 1144  GLUCAP 142* 138* 147*    Scheduled Meds: . antiseptic oral rinse  15 mL Mouth Rinse BID  . aspirin  300 mg Rectal Daily  . cloNIDine  0.2 mg Transdermal Weekly  . famotidine  20 mg Oral Daily  . feeding supplement (ENSURE)  1 Container Oral BID PC  . heparin  5,000 Units Subcutaneous 3 times per day  . insulin aspart  0-15 Units Subcutaneous 6 times per day  . methylPREDNISolone (SOLU-MEDROL) injection  60 mg Intravenous Q6H  . metoprolol  5 mg Intravenous Q4H    Continuous Infusions: . dextrose 50 mL/hr at 10/16/13 0930  . diltiazem (CARDIZEM) infusion 10 mg/hr (10/16/13 1358)    Joaquin CourtsKimberly Harris, RD, LDN, CNSC Pager [phone] After Hours Pager [phone]

## 2013-10-16 NOTE — Progress Notes (Addendum)
    Subjective:  Awake, indicates she is not uncomfortable  Objective:  Vital Signs in the last 24 hours: Temp:  [98.2 F (36.8 C)-100.5 F (38.1 C)] 99.6 F (37.6 C) (03/05 1126) Pulse Rate:  [72-140] 76 (03/05 1126) Resp:  [18-20] 18 (03/05 0800) BP: (115-186)/(64-118) 186/64 mmHg (03/05 1126) SpO2:  [94 %-96 %] 94 % (03/05 0800)  Intake/Output from previous day:  Intake/Output Summary (Last 24 hours) at 10/16/13 1350 Last data filed at 10/16/13 0521  Gross per 24 hour  Intake 279.08 ml  Output   1700 ml  Net -1420.92 ml    Physical Exam: General appearance: alert, cooperative and no distress Lungs: decreased Lt base Heart: irregularly irregular rhythm   Rate: 140  Rhythm: atrial fibrillation  Lab Results:  Recent Labs  10/15/13 0820 10/16/13 0335  WBC 6.4 5.7  HGB 8.2* 8.4*  PLT 205 211    Recent Labs  10/15/13 0820 10/16/13 0335  NA 159* 160*  K 4.0 3.8  CL 118* 119*  CO2 22 23  GLUCOSE 139* 139*  BUN 52* 66*  CREATININE 1.76* 2.03*   No results found for this basename: TROPONINI, CK, MB,  in the last 72 hours No results found for this basename: INR,  in the last 72 hours  Imaging: Imaging results have been reviewed  Cardiac Studies: Echo 10/12/13- ------------------------------------------------------------ Study Conclusions  - Left ventricle: The cavity size was normal. Wall thickness was increased in a pattern of moderate LVH. Systolic function was normal. The estimated ejection fraction was in the range of 60% to 65%. Wall motion was normal; there were no regional wall motion abnormalities. Doppler parameters are consistent with abnormal left ventricular relaxation (grade 1 diastolic dysfunction). - Aortic valve: There was no stenosis. - Mitral valve: Mildly calcified annulus. Mildly calcified leaflets . No significant regurgitation. - Left atrium: The atrium was mildly dilated. - Right ventricle: The cavity size was normal.  Systolic function was normal. - Pulmonary arteries: No complete TR doppler jet so unable to estimate PA systolic pressure. - Inferior vena cava: The vessel was normal in size; the respirophasic diameter changes were in the normal range (= 50%); findings are consistent with normal central venous pressure. - Pericardium, extracardiac: A trivial pericardial effusion was identified. Impressions:  - Normal LV size and systolic function, EF 60-65%. Moderate LV hypertrophy. Normal RV size and systolic function. No significant valvular abnormalities.   Assessment/Plan:   Principal Problem:   CVA (cerebral infarction)- Lt brain Active Problems:   Atrial fibrillation with RVR   Acute diastolic heart failure   Acute renal insufficiency   Anemia   Dysphagia following cerebral infarction- needs PEG    PLAN:  Recurrent AF around 4 am, now with RVR despite IV Diltiazem 10 mg /hr and IV Lopressor 5 mg Q 6. She is to receive a prn dose of Metoprolol now. Consider IV Amiodarone for rate if Lopressor is not effective. Document TSH and Mg++.   Corine ShelterLuke Kilroy PA-C Beeper [phone] 10/16/2013, 1:50 PM  Personally seen and examined. Agree with above.  Will move to IV amiodarone. AFIB RVR.   Donato SchultzSKAINS, Doyl Bitting, MD  ADDENDUM: Back in NSR after IV metoprolol 5mg  x1.  Will increase frequency of metoprolol IV prior to starting amio IV. CV: RRR  Donato SchultzSKAINS, Oakleigh Hesketh, MD

## 2013-10-17 ENCOUNTER — Inpatient Hospital Stay (HOSPITAL_COMMUNITY): Payer: Medicare Other

## 2013-10-17 DIAGNOSIS — I4891 Unspecified atrial fibrillation: Secondary | ICD-10-CM

## 2013-10-17 DIAGNOSIS — I6350 Cerebral infarction due to unspecified occlusion or stenosis of unspecified cerebral artery: Secondary | ICD-10-CM

## 2013-10-17 DIAGNOSIS — I5031 Acute diastolic (congestive) heart failure: Secondary | ICD-10-CM

## 2013-10-17 DIAGNOSIS — N289 Disorder of kidney and ureter, unspecified: Secondary | ICD-10-CM

## 2013-10-17 DIAGNOSIS — D649 Anemia, unspecified: Secondary | ICD-10-CM

## 2013-10-17 LAB — COMPREHENSIVE METABOLIC PANEL
ALT: 36 U/L — ABNORMAL HIGH (ref 0–35)
AST: 22 U/L (ref 0–37)
Albumin: 2.7 g/dL — ABNORMAL LOW (ref 3.5–5.2)
Alkaline Phosphatase: 48 U/L (ref 39–117)
BUN: 70 mg/dL — ABNORMAL HIGH (ref 6–23)
CO2: 24 mEq/L (ref 19–32)
Calcium: 9.1 mg/dL (ref 8.4–10.5)
Chloride: 117 mEq/L — ABNORMAL HIGH (ref 96–112)
Creatinine, Ser: 1.91 mg/dL — ABNORMAL HIGH (ref 0.50–1.10)
GFR calc Af Amer: 30 mL/min — ABNORMAL LOW (ref >90)
GFR calc non Af Amer: 25 mL/min — ABNORMAL LOW (ref >90)
Glucose, Bld: 212 mg/dL — ABNORMAL HIGH (ref 70–99)
Potassium: 3.5 mEq/L — ABNORMAL LOW (ref 3.7–5.3)
Sodium: 155 mEq/L — ABNORMAL HIGH (ref 137–147)
Total Bilirubin: 0.4 mg/dL (ref 0.3–1.2)
Total Protein: 5.6 g/dL — ABNORMAL LOW (ref 6.0–8.3)

## 2013-10-17 LAB — CBC
HCT: 25.4 % — ABNORMAL LOW (ref 36.0–46.0)
Hemoglobin: 8 g/dL — ABNORMAL LOW (ref 12.0–15.0)
MCH: 29.4 pg (ref 26.0–34.0)
MCHC: 31.5 g/dL (ref 30.0–36.0)
MCV: 93.4 fL (ref 78.0–100.0)
Platelets: 200 10*3/uL (ref 150–400)
RBC: 2.72 MIL/uL — ABNORMAL LOW (ref 3.87–5.11)
RDW: 16.7 % — ABNORMAL HIGH (ref 11.5–15.5)
WBC: 5 10*3/uL (ref 4.0–10.5)

## 2013-10-17 LAB — GLUCOSE, CAPILLARY
GLUCOSE-CAPILLARY: 164 mg/dL — AB (ref 70–99)
GLUCOSE-CAPILLARY: 164 mg/dL — AB (ref 70–99)
GLUCOSE-CAPILLARY: 179 mg/dL — AB (ref 70–99)
Glucose-Capillary: 117 mg/dL — ABNORMAL HIGH (ref 70–99)
Glucose-Capillary: 176 mg/dL — ABNORMAL HIGH (ref 70–99)
Glucose-Capillary: 190 mg/dL — ABNORMAL HIGH (ref 70–99)
Glucose-Capillary: 146 mg/dL — ABNORMAL HIGH (ref 70–99)

## 2013-10-17 LAB — MAGNESIUM: Magnesium: 3 mg/dL — ABNORMAL HIGH (ref 1.5–2.5)

## 2013-10-17 LAB — TSH: TSH: 0.358 u[IU]/mL (ref 0.350–4.500)

## 2013-10-17 MED ORDER — FUROSEMIDE 10 MG/ML IJ SOLN
20.0000 mg | Freq: Once | INTRAMUSCULAR | Status: AC
  Administered 2013-10-17: 20 mg via INTRAVENOUS

## 2013-10-17 MED ORDER — METOPROLOL TARTRATE 25 MG PO TABS
25.0000 mg | ORAL_TABLET | Freq: Two times a day (BID) | ORAL | Status: DC
  Administered 2013-10-17 – 2013-10-20 (×7): 25 mg via ORAL
  Filled 2013-10-17 (×9): qty 1

## 2013-10-17 MED ORDER — FUROSEMIDE 10 MG/ML IJ SOLN
INTRAMUSCULAR | Status: AC
  Filled 2013-10-17: qty 4

## 2013-10-17 MED ORDER — FREE WATER
200.0000 mL | Freq: Four times a day (QID) | Status: DC
  Administered 2013-10-17 – 2013-10-20 (×13): 200 mL

## 2013-10-17 MED ORDER — DILTIAZEM HCL 60 MG PO TABS
60.0000 mg | ORAL_TABLET | Freq: Four times a day (QID) | ORAL | Status: DC
  Administered 2013-10-17 – 2013-10-20 (×13): 60 mg via ORAL
  Filled 2013-10-17 (×16): qty 1

## 2013-10-17 NOTE — Progress Notes (Signed)
Pt pulled out NG tube. MD notified and recommended not to reinsert NG tube at this time pending new speech therapist evaluation in the AM. Pt daughter Lawson FiscalLori notified.Pt condition is stable. Will continue to monitor.  Colleen Canesar Levone Otten, RN

## 2013-10-17 NOTE — Progress Notes (Signed)
Patient rhythm continues to fluctuate between atrial fibrillation and sinus rhythm .At 2115 patient had 3.69 second pause and convert from atrial fibrillation to sinus rhythm. Patient asymptomatic.Will continue to monitor .

## 2013-10-17 NOTE — Progress Notes (Addendum)
Subjective:  NGT in place. PAF on tele. Rate controlled on IV metop and dilt.  Objective:  Vital Signs in the last 24 hours: Temp:  [97.6 F (36.4 C)-99.6 F (37.6 C)] 98.8 F (37.1 C) (03/06 0300) Pulse Rate:  [62-140] 66 (03/06 0300) Resp:  [18] 18 (03/06 0300) BP: (148-188)/(56-111) 182/71 mmHg (03/06 0300) SpO2:  [93 %-98 %] 98 % (03/06 0300) Weight:  [141 lb 6.4 oz (64.139 kg)] 141 lb 6.4 oz (64.139 kg) (03/06 0500)  Intake/Output from previous day:  Intake/Output Summary (Last 24 hours) at 10/17/13 0840 Last data filed at 10/17/13 0433  Gross per 24 hour  Intake    590 ml  Output    600 ml  Net    -10 ml    Physical Exam: Gen: Alert in NAD, CVA noted CV: RRR occas ectopy Lungs: wheeze, decreased bases Abd: protuberant, soft, NT Ext: no edema HEENT; NGT.  Tele: PAF, rate controlled.   Lab Results:  Recent Labs  10/16/13 0335 10/17/13 0450  WBC 5.7 5.0  HGB 8.4* 8.0*  PLT 211 200    Recent Labs  10/16/13 0335 10/17/13 0450  NA 160* 155*  K 3.8 3.5*  CL 119* 117*  CO2 23 24  GLUCOSE 139* 212*  BUN 66* 70*  CREATININE 2.03* 1.91*    Imaging: Imaging results have been reviewed  Cardiac Studies: Echo 10/12/13- ------------------------------------------------------------ Study Conclusions  - Left ventricle: The cavity size was normal. Wall thickness was increased in a pattern of moderate LVH. Systolic function was normal. The estimated ejection fraction was in the range of 60% to 65%. Wall motion was normal; there were no regional wall motion abnormalities. Doppler parameters are consistent with abnormal left ventricular relaxation (grade 1 diastolic dysfunction). - Aortic valve: There was no stenosis. - Mitral valve: Mildly calcified annulus. Mildly calcified leaflets . No significant regurgitation. - Left atrium: The atrium was mildly dilated. - Right ventricle: The cavity size was normal. Systolic function was normal. - Pulmonary  arteries: No complete TR doppler jet so unable to estimate PA systolic pressure. - Inferior vena cava: The vessel was normal in size; the respirophasic diameter changes were in the normal range (= 50%); findings are consistent with normal central venous pressure. - Pericardium, extracardiac: A trivial pericardial effusion was identified. Impressions:  - Normal LV size and systolic function, EF 60-65%. Moderate LV hypertrophy. Normal RV size and systolic function. No significant valvular abnormalities.   Assessment/Plan:   Principal Problem:   CVA (cerebral infarction)- Lt brain Active Problems:   Atrial fibrillation with RVR   Acute diastolic heart failure   Acute renal insufficiency   Anemia   Dysphagia following cerebral infarction- needs PEG  73 year old with acute stroke, AFIB parox, AKI, acute diastolic HF (EF 65%), mildly elevated Troponin on admit (?stroke), hypernatremia.  1) PAF  - I decided yesterday not to use amiodarone. She converted after administration of metoprolol IV.   - Since now has NGT, will change to PO dilt and metop. Watch BP.   - If PAF becomes more and more of a problem, could still consider amio.  2) Stoke   - dysphagia  - NGT  - ?PEG  3) Hypernatremia  - free water  4) Acute diastolic HF  - concerned that wheeze is manifesting increased volume.   - Lasix IV x 1.   - stop IVF.   5) Elevated trop  - consider outpt NUC however this may not be necessary.

## 2013-10-17 NOTE — Progress Notes (Signed)
Physical Therapy Treatment Patient Details Name: Wendy Malone MRN: [id number] DOB: 1941/05/07 Today's Date: 10/17/2013 Time: [id number] PT Time Calculation (min): 23 min  PT Assessment / Plan / Recommendation  History of Present Illness 73 yo with HTN presenting 2/28 with right hemiparesis and facial droop PMHx macular degeneration (Lt eye worse than Rt per daughter).  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  Pt also having episodes of A-fib with RVR rates into the 150s during her stay.     PT Comments   Pt is restless today.  She has a new NGT and is finally out of a-fib.  Although she did not progress today, I would really like to see her one more treatment with nutrition now on board and with the pt out of a-fib and rested to really see if she continues to be appropriate for CIR level therapies (Monday).  PT will continue to follow acutely.    Follow Up Recommendations  CIR (I need one more session where she is not in A-fib?progress)     Does the patient have the potential to tolerate intense rehabilitation    Potentially  Barriers to Discharge  Daughter works and is a single mom and cannot handle her at this level of care.        Equipment Recommendations  Wheelchair (measurements PT);Wheelchair cushion (measurements PT);Hospital bed;Other (comment)    Recommendations for Other Services   NA  Frequency Min 4X/week   Progress towards PT Goals Progress towards PT goals: Not progressing toward goals - comment (first day out of A-fib, may need one more session)  Plan Current plan remains appropriate    Precautions / Restrictions Precautions Precautions: Fall Precaution Comments: Pt with right sided weakness, left gaze preference, has been moving in and out of A-fib with RVR,  monitor HR   Pertinent Vitals/Pain See vitals flow sheet.    Mobility  Bed Mobility Overal bed mobility: Needs Assistance Bed Mobility: Supine to Sit Supine to sit: +2 for physical assistance;Max assist General bed mobility comments: Pt did reach her left arm and clutch onto therapist during transition to sit, but was unable to initiate movement of her left leg towards the EOB.  Complete assist needed to move right arm and leg.  Transfers Overall transfer level: Needs assistance Equipment used: None Transfers: Sit to/from BJ'sStand;Stand Pivot Transfers Sit to Stand: +2 safety/equipment;Max assist Stand pivot transfers: +2 safety/equipment;Max assist General transfer comment: Max assist to support trunk to get to standing with bed pad to extend hips.  Pt holding on with her left arm and taking weight on her left leg, but was unable to weight shift to the right enough to take a step on the left.  Total assist needed to turn towards the recliner chair on the pt's right side.   Modified Rankin (Stroke Patients Only) Pre-Morbid Rankin Score: No symptoms Modified Rankin: Severe disability      PT Goals (current goals can now be found in the care plan section) Acute Rehab PT Goals Patient Stated Goal: uhable to state.  Daughter would like for her to go to CIR level therapies at d/c from acute hospital setting.   Visit Information  Last PT Received On: 10/17/13 Assistance Needed: +2 History of Present Illness: 73 yo with HTN presenting 2/28 with right hemiparesis and facial droop PMHx macular degeneration (Lt  eye worse than Rt per daughter).  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  Pt also having episodes of A-fib with RVR rates into the 150s during her stay.      Subjective  Data  Subjective: Pt is moaning out words at times that are translateable.   Patient Stated Goal: uhable to state.  Daughter would like for her to go to CIR level therapies at d/c from acute hospital setting.    Cognition  Cognition Arousal/Alertness: Lethargic Behavior During Therapy: Restless Overall Cognitive Status: Impaired/Different from baseline Area of Impairment: Attention;Following commands;Safety/judgement;Awareness;Problem solving Current Attention Level: Focused Memory: Decreased recall of precautions Following Commands: Follows one step commands inconsistently;Follows one step commands with increased time Safety/Judgement: Decreased awareness of safety;Decreased awareness of deficits Awareness: Intellectual Problem Solving: Slow processing;Decreased initiation;Difficulty sequencing;Requires verbal cues;Requires tactile cues General Comments: Pt seemed more restless in sitting today.  She kept moving her head around and around and throwing her trunk around which she was not doing yesterday.  It was difficult to even get her to focus her attention at all.   Difficult to assess due to: Impaired communication    Balance  Balance Overall balance assessment: Needs assistance Sitting-balance support: Feet supported;Single extremity supported Sitting balance-Leahy Scale: Zero Sitting balance - Comments: Pt needed max assist sitting EOB to maintain sitting balance due to restless nature.  Her head was moving constantly.  Her trunk and left arm were moving constatnly and she was not focued enough to process verbal cues to sit still and look up.  In sitting worked on upright posture, finding midline gaze, and following commands.  Postural control: Posterior lean;Left lateral lean;Right lateral lean;Other (comment) (at times throws herself forward as well) Standing balance support: Single extremity supported Standing balance-Leahy Scale: Zero General Comments General comments (skin  integrity, edema, etc.): VSS throughout session.   End of Session PT - End of Session Equipment Utilized During Treatment: Gait belt Activity Tolerance: Other (comment) (by restlessness) Patient left: in chair;with call bell/phone within reach Nurse Communication: Mobility status;Need for lift equipment     Chinmayi Rumer B. Mehr Depaoli, PT, DPT [phone]   10/17/2013, 4:31 PM

## 2013-10-17 NOTE — Progress Notes (Signed)
NG tube advanced ~3 cm per recommendations from radiologist on abdominal x-ray results.  Patient tolerated well.  Meds given via tube, free water 200 mL given via tube, and tube feedings restarted at 20 mL per hour.  HOB at 30 degrees.  Will continue to monitor.  Wendy Malone, Wendy Malone

## 2013-10-17 NOTE — Progress Notes (Signed)
MD order received to replace NG tube in order for patient to get her cardiac meds and tube feeding tonight.  #16 French NG tube placed in left nare with minimal difficulty.  Patient tolerated well.  Awaiting abdominal x-ray to confirm placement prior to use.  Will continue to monitor.  Alonza Bogusuvall, Ramsie Ostrander Gray

## 2013-10-17 NOTE — Progress Notes (Signed)
Occupational Therapy Treatment Patient Details Name: Wendy Hatchnn SmithMRN: [id number] DOB: 15-Feb-1941 Today's Date: 10/17/2013 Time: 1110-1203 OT Time Calculation (min): 53 min  OT Assessment / Plan / Recommendation  History of present illness 73 yo with HTN presenting 2/28 with right hemiparesis and facial droop PMHx macular degeneration (Lt eye worse than Rt per daughter).  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  Pt also having episodes of A-fib with RVR rates into the 150s during her stay.     OT comments  Pt. Worked on bed mobility and requires Max A to roll R and to L with use of L UE. Pt. Was Total A with supine to sit and  Sit to supine. Pt. Worked on EOB sitting balance with WB on R UE and returning to midline. Pt. Dtr. Ed. On performing ROM on pt. R UE. Pt. Dtr. Will need further instruction and to demo understanding.   Follow Up Recommendations       Barriers to Discharge       Equipment Recommendations       Recommendations for Other Services    Frequency     Progress towards OT Goals Progress towards OT goals: Progressing toward goals  Plan      Precautions / Restrictions Precautions Precautions: Fall Precaution Comments: Pt with right sided weakness, left gaze preference, has been moving in and out of A-fib with RVR, monitor HR Restrictions Weight Bearing Restrictions: No   Pertinent Vitals/Pain No c/o    ADL       OT Diagnosis:    OT Problem List:   OT Treatment Interventions:     OT Goals(current goals can now be found in the care plan section) Acute Rehab OT Goals Patient Stated Goal: uhable to state.  Daughter would like for her to go to CIR level therapies at d/c from acute hospital setting.   Visit Information  Last OT Received On:  10/17/13 Assistance Needed: +2 History of Present Illness: 73 yo with HTN presenting 2/28 with right hemiparesis and facial droop PMHx macular degeneration (Lt eye worse than Rt per daughter).  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  Pt also having episodes of A-fib with RVR rates into the 150s during her stay.      Subjective Data      Prior Functioning       Cognition  Cognition Arousal/Alertness: Awake/alert Behavior During Therapy: Restless Overall Cognitive Status: Impaired/Different from baseline Area of Impairment: Attention;Following commands;Safety/judgement;Awareness;Problem solving Current Attention Level: Focused Following Commands: Follows one step commands inconsistently;Follows one step commands with increased time Safety/Judgement: Decreased awareness of safety;Decreased awareness of deficits    Mobility  Bed Mobility Overal bed mobility: Needs Assistance Bed Mobility: Supine to Sit Supine to sit: Total assist Sit to supine: Total assist    Exercises      Balance Balance Sitting-balance support: Feet unsupported;Single extremity supported Sitting balance-Leahy Scale: Poor Postural control: Right lateral lean  End of Session OT - End of Session Activity Tolerance: Patient limited by fatigue Patient left: in bed;with call bell/phone within reach;with family/visitor present Nurse Communication: Mobility status;Precautions;Other (comment)  GO     Tatjana Turcott 10/17/2013, 12:01 PM

## 2013-10-17 NOTE — Progress Notes (Signed)
Patient ID: Wendy Malone  female  ZOX:[id number]    DOB: October 29, 1940    DOA: 10/11/2013  PCP: No primary provider on file.  Brief history of present illness 73 years old female with PMH relevant for HTN and GERD. Presented with right hemiparesis and facial droop. Last seen normal the day prior. Out of window for TPA. Patient was alert awake and oriented and protecting her airway. She was hemodynamically stable however in Intermittent rapid A. Fib on amiodarone drip.  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  Uncontrolled hypertension - Placed on clonidine patch, on scheduled metoprolol, on IV labetalol and hydralazine as needed   Acute respiratory failure Resolved, secondary to pulmonary edema Will give one dose of IV lasix 20 mg x 1 - 2-D echo was done 3/1 which had shown EF of 60-65% with grade 1 diastolic dysfunction   Dysphagia - GI consult called for anemia workup and possible PEG tube placement. Discussed in detail with the patient's daughter at the bedside .  This was discussed by neurology service as well. Prognosis for full recovery is poor and will need long-term placement. Patient has been seen by GI and not feel any workup for her anemia is needed at this time the recommended placement of a panda tube, patient started on dysphagia diet, and did not do well with the diet, she has now NG tube in with tube feedings.    Atrial fibrillation  - Paroxysmal, currently in sinus rhythm, started on Po metoprolol and cardizem - Currently on aspirin PR, per cardiology, will need to be anticoagulated when cleared by neurology   - also workup for anemia prior to starting anticoagulation   Anemia - B12 folate normal, 42, ferritin 92 - Dr Isidoro Donning discussed in detail with the patient's daughter, her mother did not want any invasive procedures/colonoscopy when her primary care physician had suggested In December 2014  Acute kidney failure  -Creatinine is now improved with IV fluids, but now has developed mild fluid overload. Will give one dose of lasix, d/c IV fluids, free water through the NG tube.   Hypernatremia Sodium is improving, will start Free water 200 ml q 6 hrs    Code Status: DO NOT RESUSCITATE status   Family Communication: discussed in detail with patient's daughter at the bedside   Disposition: will likely need skilled nursing facility vs CIR  Consultants:  Neurology    cardiology  Gastroenterology   Procedures:  Multiple procedures, 2-D echo, carotid Dopplers, MRI/MRA   Antibiotics:  None     Subjective: The patient seen and examined, alert, NG tube in place.  Objective: Weight change:   Intake/Output Summary (Last 24 hours)  at 10/17/13 1431 Last data filed at 10/17/13 0433  Gross per 24 hour  Intake    590 ml  Output    600 ml  Net    -10 ml   Blood pressure 155/65, pulse 65, temperature 98.2 F (36.8 C), temperature source Oral, resp. rate 18, height 5' (1.524 m), weight 64.139 kg (141 lb 6.4 oz), SpO2  94.00%.  Physical Exam: Physical Exam: Head: Normocephalic, atraumatic.  Eyes: No signs of jaundice, EOMI Nose: Mucous membranes dry.  Throat: Oropharynx nonerythematous, no exudate appreciated.  Neck: supple,No deformities, masses, or tenderness noted. Lungs: Bilateral wheezing  Heart: Regular RR. S1 and S2 normal  Abdomen: BS normoactive. Soft, Nondistended, non-tender.  Extremities: No pretibial edema, no erythema   Lab Results: Basic Metabolic Panel:  Recent Labs Lab 10/16/13 0335 10/17/13 0450  NA 160* 155*  K 3.8 3.5*  CL 119* 117*  CO2 23 24  GLUCOSE 139* 212*  BUN 66* 70*  CREATININE 2.03* 1.91*  CALCIUM 9.6 9.1  MG  --  3.0*   Liver Function Tests:  Recent Labs Lab 10/11/13 1857 10/17/13 0450  AST 53* 22  ALT 20 36*  ALKPHOS 68 48  BILITOT 0.4 0.4  PROT 6.1 5.6*  ALBUMIN 3.2* 2.7*   No results found for this basename: LIPASE, AMYLASE,  in the last 168 hours No results found for this basename: AMMONIA,  in the last 168 hours CBC:  Recent Labs Lab 10/11/13 1857  10/16/13 0335 10/17/13 0450  WBC 8.9  < > 5.7 5.0  NEUTROABS 7.1  --   --   --   HGB 9.6*  < > 8.4* 8.0*  HCT 28.1*  < > 26.9* 25.4*  MCV 87.3  < > 93.1 93.4  PLT 198  < > 211 200  < > = values in this interval not displayed. Cardiac Enzymes:  Recent Labs Lab 10/11/13 1857 10/11/13 2206 10/12/13 0327 10/12/13 1450  CKTOTAL 710*  --   --   --   CKMB 18.8*  --   --   --   TROPONINI 4.15* 4.85* 4.45* 2.36*   BNP: No components found with this basename: POCBNP,  CBG:  Recent Labs Lab 10/17/13 0008 10/17/13 0346 10/17/13 0650 10/17/13 0821 10/17/13 1204  GLUCAP 117* 190* 164* 176* 164*     Micro Results: Recent Results (from the past 240 hour(s))  MRSA PCR SCREENING     Status: None   Collection Time    10/11/13  9:40 PM      Result Value Ref Range Status   MRSA by PCR NEGATIVE  NEGATIVE Final   Comment:            The GeneXpert MRSA Assay (FDA     approved for  NASAL specimens     only), is one component of a     comprehensive MRSA colonization     surveillance program. It is not     intended to diagnose MRSA     infection nor to guide or     monitor treatment for     MRSA infections.    Studies/Results: Ct Angio Head W/cm &/or Wo Cm  10/11/2013   CLINICAL DATA:  Code stroke. Right-sided weakness and slurred speech.  EXAM: CT ANGIOGRAPHY HEAD  TECHNIQUE: Multidetector CT imaging of the head was performed using the standard protocol during bolus administration of intravenous contrast. Multiplanar CT image reconstructions and MIPs were obtained to evaluate the vascular anatomy.  CONTRAST:  50mL  OMNIPAQUE IOHEXOL 350 MG/ML SOLN  COMPARISON:  None.  FINDINGS: Gray-white differentiation is preserved the source images and postcontrast images without evidence for a focal cortical infarct. There are multiple lacunar infarcts within the basal ganglia bilaterally which are age-indeterminate. Extensive periventricular white matter hypoattenuation is present bilaterally.  No hemorrhage or mass lesion is evident.  The cervical internal carotid arteries are within normal limits bilaterally. Calcified and noncalcified plaque is present within the cavernous carotid arteries bilaterally. There is a moderate stenosis of the right cavernous carotid artery just proximal to the genu. Mild to moderate narrowing is present in the supra clinoid left internal carotid artery. Supra clinoid segments are normal bilaterally. The A1 and M1 segments are normal bilaterally. The anterior communicating artery is patent.  Segmental narrowing within the pericallosal arteries is worse on the right. There is segmental narrowing of distal MCA branch vessels bilaterally, right greater than left.  The left vertebral artery is dominant. The right PICA origin is visualized and normal. The left AICA is dominant. The basilar artery is within normal limits. Both posterior cerebral arteries originate from  the basilar tip. Segmental stenoses are present in the proximal PCA branch vessels bilaterally. The dural sinuses are patent.  Review of the MIP images confirms the above findings.  IMPRESSION: 1. No acute or subacute cortical infarct is evident. 2. Bilateral basal ganglia infarcts are age indeterminate. 3. 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 4. Segmental stenoses within the PCA branch vessels bilaterally and right greater than left MCA branch vessels. 5. Segmental stenoses within the pericallosal arteries, right greater than left.   Electronically Signed   By: Gennette Pac M.D.   On: 10/11/2013 21:31   Dg Chest 2 View  10/11/2013   CLINICAL DATA:  Stroke.  EXAM: CHEST  2 VIEW  COMPARISON:  None.  FINDINGS: Cardiac silhouette is mildly enlarged. Normal mediastinal and hilar contours.  There are prominent bronchovascular markings. Lungs are otherwise clear. No pleural effusion or pneumothorax.  The bony thorax is intact.  IMPRESSION: No acute cardiopulmonary disease.   Electronically Signed   By: Amie Portland M.D.   On: 10/11/2013 21:03   Ct Head Wo Contrast  10/12/2013   CLINICAL DATA:  Altered mental status.  Unable to speak.  EXAM: CT HEAD WITHOUT CONTRAST  TECHNIQUE: Contiguous axial images were obtained from the base of the skull through the vertex without intravenous contrast.  COMPARISON:  CTA of the head performed 10/11/2013  FINDINGS: There is no evidence of acute infarction, mass lesion, or intra- or extra-axial hemorrhage on CT.  Diffuse periventricular and subcortical white matter change likely reflects small vessel ischemic microangiopathy. Scattered chronic lacunar infarcts are seen within the basal ganglia bilaterally.  The posterior fossa, including the cerebellum, brainstem and fourth ventricle, is within normal limits. The third and lateral ventricles are unremarkable in appearance. The cerebral hemispheres demonstrate grossly normal gray-white  differentiation. No mass effect or midline shift is seen.  There is no evidence of fracture; visualized osseous structures are unremarkable in appearance. The visualized portions of the orbits are within normal limits. The paranasal sinuses and mastoid air cells are well-aerated. No significant soft tissue abnormalities are seen.  IMPRESSION: 1. No acute intracranial pathology seen on CT. 2. Diffuse small vessel ischemic microangiopathy again noted; scattered chronic lacunar infarcts within the basal ganglia bilaterally.   Electronically Signed   By: Roanna Raider M.D.   On: 10/12/2013 03:26   Mr Brain Wo Contrast  10/12/2013   CLINICAL DATA:  Slurred speech. Ischemic stroke. Right hemiparesis and facial droop.  EXAM: MRI HEAD WITHOUT CONTRAST  TECHNIQUE: Multiplanar, multiecho pulse sequences of the brain and surrounding structures were obtained without intravenous contrast.  COMPARISON:  CT head without contrast 10/12/2013.  FINDINGS: Diffusion-weighted images demonstrate an acute nonhemorrhagic infarct involving the posterior left lentiform nucleus extending into the body of caudate and coronal radiata. The area measures 3.2 x 2.5 x 1.4 cm. The area is present within the region of diffuse white matter disease. Advanced atrophy and extensive white matter changes are present bilaterally. Remote lacunar infarcts are present in the brainstem and inferior cerebellum bilaterally. Remote lacunar infarcts are present in the basal ganglia bilaterally as well.  Flow is present in the major intracranial arteries. The globes and orbits are intact. Mild mucosal thickening is present in the sphenoid sinuses bilaterally. There is minimal mucosal thickening in the ethmoid air cells and inferior left frontal sinus. The mastoid air cells are clear.  IMPRESSION: 1. Acute nonhemorrhagic infarct involving the posterior left lentiform nucleus extending superiorly into the corona radiata. 2. Additional remote lacunar infarcts are  present in the basal ganglia, brainstem, inferior cerebellum bilaterally. 3. Atrophy and diffuse white matter changes are present bilaterally including the area of acute infarct. This likely reflects the sequelae of chronic microvascular ischemia. 4. Minimal sinus disease. These results were called by telephone at the time of interpretation on 10/12/2013 at 11:55 AM to Dr. Loretha Brasil, who verbally acknowledged these results.   Electronically Signed   By: Gennette Pac M.D.   On: 10/12/2013 11:58   Dg Chest Port 1 View  10/14/2013   CLINICAL DATA:  Respiratory distress.  EXAM: PORTABLE CHEST - 1 VIEW  COMPARISON:  There prior  FINDINGS: Cardiac silhouette remains mildly enlarged, mediastinal silhouette is nonsuspicious, calcified aortic knob. Diffuse interstitial prominence increased from prior examination with patchy bibasilar airspace opacities. Small right pleural effusion. No pneumothorax.  Patient is osteopenic. Left antecubital intravenous catheter in place. Multiple EKG lines overlie the patient and may obscure subtle underlying pathology.  IMPRESSION: Stable cardiomegaly with increasing interstitial prominence suggesting pulmonary edema, mild patchy airspace opacity lung bases may reflect confluent edema, atelectasis, less likely pneumonia with small right pleural effusion.   Electronically Signed   By: Awilda Metro   On: 10/14/2013 05:03    Medications: Scheduled Meds: . antiseptic oral rinse  15 mL Mouth Rinse BID  . aspirin  300 mg Rectal Daily  . cloNIDine  0.2 mg Transdermal Weekly  . diltiazem  60 mg Oral QID  . famotidine  20 mg Oral Daily  . free water  200 mL Per Tube Q6H  . furosemide      . heparin  5,000 Units Subcutaneous 3 times per day  . insulin aspart  0-15 Units Subcutaneous 6 times per day  . metoprolol tartrate  25 mg Oral BID      LOS: 6 days   [employer] S M.D. [facility] 10/17/2013, 2:31 PM Pager: [phone]  If 7PM-7AM, please contact  night-coverage [email or web address] Password [email or web address]

## 2013-10-17 NOTE — Progress Notes (Signed)
SPEECH PATHOLOGY   In-depth education with pt.'s daughter, Lawson FiscalLori, re: need for continued po trials/dysphagia therapy, despite presence of NG tube, in attempt to improve swallow function for return to po's.  Daughter expressed frustration with her mother's situation, feeling that her mother's life style choices have caused her illness.  Supportive listening and encouragement provided.  Daughter is an only child and is very overwhelmed with the decisions required re: her mother's care.  Daughter request that she be educated to the options prior to being "forced" to make these decisions, and time to process the information.  Plan:  Continue ST for aphasia, apraxia, and dysphagia therapy.  PO trials with SLP during swallowing retraining. Recommend CIR when stable.

## 2013-10-18 LAB — CBC
HCT: 30.5 % — ABNORMAL LOW (ref 36.0–46.0)
Hemoglobin: 9.8 g/dL — ABNORMAL LOW (ref 12.0–15.0)
MCH: 29.7 pg (ref 26.0–34.0)
MCHC: 32.1 g/dL (ref 30.0–36.0)
MCV: 92.4 fL (ref 78.0–100.0)
PLATELETS: 237 10*3/uL (ref 150–400)
RBC: 3.3 MIL/uL — ABNORMAL LOW (ref 3.87–5.11)
RDW: 16.2 % — AB (ref 11.5–15.5)
WBC: 6.8 10*3/uL (ref 4.0–10.5)

## 2013-10-18 LAB — BASIC METABOLIC PANEL
BUN: 88 mg/dL — AB (ref 6–23)
CALCIUM: 9.4 mg/dL (ref 8.4–10.5)
CO2: 23 mEq/L (ref 19–32)
CREATININE: 2.04 mg/dL — AB (ref 0.50–1.10)
Chloride: 114 mEq/L — ABNORMAL HIGH (ref 96–112)
GFR calc Af Amer: 27 mL/min — ABNORMAL LOW (ref >90)
GFR, EST NON AFRICAN AMERICAN: 24 mL/min — AB (ref >90)
GLUCOSE: 208 mg/dL — AB (ref 70–99)
POTASSIUM: 3.4 meq/L — AB (ref 3.7–5.3)
SODIUM: 152 meq/L — AB (ref 137–147)

## 2013-10-18 LAB — GLUCOSE, CAPILLARY
GLUCOSE-CAPILLARY: 143 mg/dL — AB (ref 70–99)
GLUCOSE-CAPILLARY: 147 mg/dL — AB (ref 70–99)
Glucose-Capillary: 135 mg/dL — ABNORMAL HIGH (ref 70–99)
Glucose-Capillary: 153 mg/dL — ABNORMAL HIGH (ref 70–99)
Glucose-Capillary: 180 mg/dL — ABNORMAL HIGH (ref 70–99)
Glucose-Capillary: 98 mg/dL (ref 70–99)

## 2013-10-18 NOTE — Progress Notes (Signed)
NGT inserted into left nare per protocol. Placement verified with stethescope and piston syringe. Moderate amount of gastric content received per piston syringe, refeed and hooked up feeding at 40cc hour, that was original setting this am before the patient pulled the NGT out. Left hand in a soft restraint in order to keep NGT in place as well as foley catheter and telemetry monitoring. Tolerated procedure well.

## 2013-10-18 NOTE — Progress Notes (Signed)
Patient ID: Lake Bellsnn Flahive, female   DOB: Oct 09, 1940, 73 y.o.   MRN: [id number]    Subjective:  NGT removed. Patient pulled out PAF has resolved. Remains with severe aphasia  Objective:  Vital Signs in the last 24 hours: Temp:  [97.2 F (36.2 C)-99.7 F (37.6 C)] 99.7 F (37.6 C) (03/07 0727) Pulse Rate:  [52-80] 64 (03/07 0727) Resp:  [18] 18 (03/07 0727) BP: (144-201)/(49-67) 161/65 mmHg (03/07 0727) SpO2:  [90 %-97 %] 97 % (03/07 0727) Weight:  [147 lb 11.3 oz (67 kg)] 147 lb 11.3 oz (67 kg) (03/07 [id number])  Intake/Output from previous day:  Intake/Output Summary (Last 24 hours) at 10/18/13 0945 Last data filed at 10/18/13 0919  Gross per 24 hour  Intake      0 ml  Output    625 ml  Net   -625 ml    Physical Exam: Gen: Alert in NAD, CVA noted CV: RRR occas ectopy Lungs: wheeze, decreased bases Abd: protuberant, soft, NT Ext: no edema HEENT - facial droop  Tele: NSR.   Lab Results:  Recent Labs  10/17/13 0450 10/18/13 0527  WBC 5.0 6.8  HGB 8.0* 9.8*  PLT 200 237    Recent Labs  10/17/13 0450 10/18/13 0527  NA 155* 152*  K 3.5* 3.4*  CL 117* 114*  CO2 24 23  GLUCOSE 212* 208*  BUN 70* 88*  CREATININE 1.91* 2.04*    Imaging: Imaging results have been reviewed  Cardiac Studies: Echo 10/12/13- ------------------------------------------------------------ Study Conclusions  - Left ventricle: The cavity size was normal. Wall thickness was increased in a pattern of moderate LVH. Systolic function was normal. The estimated ejection fraction was in the range of 60% to 65%. Wall motion was normal; there were no regional wall motion abnormalities. Doppler parameters are consistent with abnormal left ventricular relaxation (grade 1 diastolic dysfunction). - Aortic valve: There was no stenosis. - Mitral valve: Mildly calcified annulus. Mildly calcified leaflets . No significant regurgitation. - Left atrium: The atrium was mildly dilated. - Right ventricle:  The cavity size was normal. Systolic function was normal. - Pulmonary arteries: No complete TR doppler jet so unable to estimate PA systolic pressure. - Inferior vena cava: The vessel was normal in size; the respirophasic diameter changes were in the normal range (= 50%); findings are consistent with normal central venous pressure. - Pericardium, extracardiac: A trivial pericardial effusion was identified. Impressions:  - Normal LV size and systolic function, EF 60-65%. Moderate LV hypertrophy. Normal RV size and systolic function. No significant valvular abnormalities.   Assessment/Plan:   Principal Problem:   CVA (cerebral infarction)- Lt brain Active Problems:   Atrial fibrillation with RVR   Acute diastolic heart failure   Acute renal insufficiency   Anemia   Dysphagia following cerebral infarction- needs PEG  73 year old with acute stroke, AFIB parox, AKI, acute diastolic HF (EF 65%), mildly elevated Troponin on admit (?stroke), hypernatremia.  1) PAF  - maintaining NSR. Hold off on amiodarone for now 2) Stoke   - still with marked deficits. Await neuro service recommendation regarding timing of initiation of anticoagulation. Continue ASA for now.   3) Hypernatremia, improved slightly  - free water  4) Acute diastolic HF  - she has receive lasix but weight is actually up. ? Accuracy. She has no chf symptoms on exam.  5) Elevated trop  - would hold off on workup at this point. No evidence of ongoing ischemia.   Leonia ReevesGregg Dontrey Snellgrove,M.D.

## 2013-10-18 NOTE — Progress Notes (Addendum)
Patient ID:Wendy Malone      DOB: [birth year]      ZOX:[id number]   Consult for Goals of Care received. Spoke with patient'Wendy Malone daughter.  Plan for meeting Sunday 3/7 at 11am  Geneen Dieter L. Ladona Ridgelaylor, MD MBA [facility] Palliative Medicine Team at [facility] Phone: [phone] Pager: [phone]

## 2013-10-18 NOTE — Progress Notes (Signed)
Patient ID: Wendy Malone  female  ZOX:[id number]    DOB: 05-22-1941    DOA: 10/11/2013  PCP: No primary provider on file.  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   Dysphagia  - GI consult called for anemia workup and possible PEG tube placement. Prognosis for full recovery is poor and will need long-term placement. -  Patient has been seen by GI and not feel any workup for her anemia is needed at this time and recommended placement of a panda tube - Patient was started on dysphagia diet, and did not do well with the diet, NG tube was placed however she pulled it out last night. - Awaiting speech therapist, if fails then NG tube will need to be put back and place PEG?. I discussed in detail with her daughter,Wendy Malone who feels frustrated with all the issues and feels that her mother never went to doctors, she probably never wanted all this or PEG tubes. She feels that she needs someone who can guide her through these decisions.          Uncontrolled hypertension  - Placed on clonidine patch, on scheduled metoprolol, Cardizem on IV labetalol and hydralazine as needed   Acute respiratory failure  - Resolved, secondary to pulmonary edema  - 2-D echo was done 3/1 which had shown EF of 60-65% with grade 1 diastolic dysfunction   Atrial fibrillation  - Paroxysmal, currently in sinus rhythm, started on Po metoprolol and cardizem  - Currently on aspirin PR, per cardiology, will need to be  anticoagulated when cleared by neurology  - also workup for anemia prior to starting anticoagulation  Anemia  - B12 folate normal, 42, ferritin 92  - I had discussed in detail with the patient's daughter on 3/2, her mother did not want any invasive procedures/colonoscopy when her primary care physician had suggested In December 2014   Acute kidney failure  -Creatinine is now improved with IV fluids, but now has developed mild fluid overload. Will give one dose of lasix, d/c IV fluids, free water through the NG tube when placed back   Hypernatremia  Sodium is improving, will start Free water 200 ml q 6 hrs   DVT Prophylaxis:  Code Status: DO NOT RESUSCITATE   Family Communication: discussed in detail with the patient's daughter, Lawson Fiscal   Disposition:  Consultants:  Cardiology    palliative medicine consulted today for goals of care   Subjective: Alert and awake, tries to communicate, severe dysarthria and dysphagia, pulled out her NG tube again   Objective: Weight change: 2.861 kg (6 lb 4.9 oz)  Intake/Output Summary (Last 24 hours) at 10/18/13 1115 Last data filed at 10/18/13 0919  Gross per 24 hour  Intake      0 ml  Output    625 ml  Net   -625 ml   Blood pressure 161/65, pulse 64, temperature 99.7 F (37.6 C), temperature source Axillary, resp. rate 18, height 5' (1.524 m), weight 67 kg (147 lb 11.3 oz), SpO2 97.00%.  Physical Exam: General: Alert and awake, dysarthria HEENT: anicteric  sclera, PERLA, EOMI CVS: S1-S2 clear, no murmur rubs or gallops Chest:  mild scattered wheezing  Abdomen: soft nontender, nondistended, normal bowel sounds  Extremities: no cyanosis, clubbing or edema noted bilaterally    Lab Results: Basic Metabolic Panel:  Recent Labs Lab 10/17/13 0450 10/18/13 0527  NA 155* 152*  K 3.5* 3.4*  CL 117* 114*  CO2 24 23  GLUCOSE 212* 208*  BUN 70* 88*  CREATININE 1.91* 2.04*  CALCIUM 9.1 9.4  MG 3.0*  --    Liver Function  Tests:  Recent Labs Lab 10/11/13 1857 10/17/13 0450  AST 53* 22  ALT 20 36*  ALKPHOS 68 48  BILITOT 0.4 0.4  PROT 6.1 5.6*  ALBUMIN 3.2* 2.7*   No results found for this basename: LIPASE, AMYLASE,  in the last 168 hours No results found for this basename: AMMONIA,  in the last 168 hours CBC:  Recent Labs Lab 10/11/13 1857  10/17/13 0450 10/18/13 0527  WBC 8.9  < > 5.0 6.8  NEUTROABS 7.1  --   --   --   HGB 9.6*  < > 8.0* 9.8*  HCT 28.1*  < > 25.4* 30.5*  MCV 87.3  < > 93.4 92.4  PLT 198  < > 200 237  < > = values in this interval not displayed. Cardiac Enzymes:  Recent Labs Lab 10/11/13 1857 10/11/13 2206 10/12/13 0327 10/12/13 1450  CKTOTAL 710*  --   --   --   CKMB 18.8*  --   --   --   TROPONINI 4.15* 4.85* 4.45* 2.36*   BNP: No components found with this basename: POCBNP,  CBG:  Recent Labs Lab 10/17/13 1633 10/17/13 2110 10/18/13 0025 10/18/13 0416 10/18/13 0733  GLUCAP 179* 146* 135* 180* 153*     Micro Results: Recent Results (from the past 240 hour(s))  MRSA PCR SCREENING     Status: None   Collection Time    10/11/13  9:40 PM      Result Value Ref Range Status   MRSA by PCR NEGATIVE  NEGATIVE Final   Comment:            The GeneXpert MRSA Assay (FDA     approved for NASAL specimens     only), is one component of a     comprehensive MRSA colonization     surveillance program. It is not     intended to diagnose MRSA     infection nor to guide or     monitor treatment for     MRSA infections.    Studies/Results: Ct Angio Head W/cm &/or Wo Cm  10/11/2013   CLINICAL DATA:  Code stroke. Right-sided weakness and slurred speech.  EXAM: CT ANGIOGRAPHY HEAD  TECHNIQUE: Multidetector CT imaging of the head was performed using the standard protocol during bolus administration of intravenous contrast. Multiplanar CT image reconstructions and MIPs were obtained to evaluate the vascular anatomy.  CONTRAST:  50mL OMNIPAQUE IOHEXOL 350 MG/ML SOLN   COMPARISON:  None.  FINDINGS: Gray-white differentiation is preserved the source images and postcontrast images without evidence for a focal cortical infarct. There are multiple lacunar infarcts within the basal ganglia bilaterally which are age-indeterminate. Extensive periventricular white matter hypoattenuation is present bilaterally.  No hemorrhage or mass lesion is evident.  The cervical internal carotid arteries are within normal limits bilaterally. Calcified and noncalcified plaque is present within the cavernous carotid arteries bilaterally. There is a moderate stenosis of the right cavernous carotid artery  just proximal to the genu. Mild to moderate narrowing is present in the supra clinoid left internal carotid artery. Supra clinoid segments are normal bilaterally. The A1 and M1 segments are normal bilaterally. The anterior communicating artery is patent.  Segmental narrowing within the pericallosal arteries is worse on the right. There is segmental narrowing of distal MCA branch vessels bilaterally, right greater than left.  The left vertebral artery is dominant. The right PICA origin is visualized and normal. The left AICA is dominant. The basilar artery is within normal limits. Both posterior cerebral arteries originate from the basilar tip. Segmental stenoses are present in the proximal PCA branch vessels bilaterally. The dural sinuses are patent.  Review of the MIP images confirms the above findings.  IMPRESSION: 1. No acute or subacute cortical infarct is evident. 2. Bilateral basal ganglia infarcts are age indeterminate. 3. 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 4. Segmental stenoses within the PCA branch vessels bilaterally and right greater than left MCA branch vessels. 5. Segmental stenoses within the pericallosal arteries, right greater than left.   Electronically Signed   By: Gennette Pac M.D.   On: 10/11/2013 21:31   Dg Chest 2 View  10/11/2013    CLINICAL DATA:  Stroke.  EXAM: CHEST  2 VIEW  COMPARISON:  None.  FINDINGS: Cardiac silhouette is mildly enlarged. Normal mediastinal and hilar contours.  There are prominent bronchovascular markings. Lungs are otherwise clear. No pleural effusion or pneumothorax.  The bony thorax is intact.  IMPRESSION: No acute cardiopulmonary disease.   Electronically Signed   By: Amie Portland M.D.   On: 10/11/2013 21:03   Ct Head Wo Contrast  10/12/2013   CLINICAL DATA:  Altered mental status.  Unable to speak.  EXAM: CT HEAD WITHOUT CONTRAST  TECHNIQUE: Contiguous axial images were obtained from the base of the skull through the vertex without intravenous contrast.  COMPARISON:  CTA of the head performed 10/11/2013  FINDINGS: There is no evidence of acute infarction, mass lesion, or intra- or extra-axial hemorrhage on CT.  Diffuse periventricular and subcortical white matter change likely reflects small vessel ischemic microangiopathy. Scattered chronic lacunar infarcts are seen within the basal ganglia bilaterally.  The posterior fossa, including the cerebellum, brainstem and fourth ventricle, is within normal limits. The third and lateral ventricles are unremarkable in appearance. The cerebral hemispheres demonstrate grossly normal gray-white differentiation. No mass effect or midline shift is seen.  There is no evidence of fracture; visualized osseous structures are unremarkable in appearance. The visualized portions of the orbits are within normal limits. The paranasal sinuses and mastoid air cells are well-aerated. No significant soft tissue abnormalities are seen.  IMPRESSION: 1. No acute intracranial pathology seen on CT. 2. Diffuse small vessel ischemic microangiopathy again noted; scattered chronic lacunar infarcts within the basal ganglia bilaterally.   Electronically Signed   By: Roanna Raider M.D.   On: 10/12/2013 03:26   Mr Brain Wo Contrast  10/12/2013   CLINICAL DATA:  Slurred speech. Ischemic stroke. Right  hemiparesis and facial droop.  EXAM: MRI HEAD WITHOUT CONTRAST  TECHNIQUE: Multiplanar, multiecho pulse sequences of the brain and surrounding structures were obtained without intravenous contrast.  COMPARISON:  CT head without contrast 10/12/2013.  FINDINGS: Diffusion-weighted images demonstrate an acute nonhemorrhagic infarct involving the posterior left lentiform nucleus extending into the body of caudate and coronal radiata. The area measures 3.2 x 2.5 x 1.4 cm. The area is present within the region of diffuse white matter disease.  Advanced atrophy and extensive white matter changes are present bilaterally. Remote lacunar infarcts are present in the brainstem and inferior cerebellum bilaterally. Remote lacunar infarcts are present in the basal ganglia bilaterally as well.  Flow is present in the major intracranial arteries. The globes and orbits are intact. Mild mucosal thickening is present in the sphenoid sinuses bilaterally. There is minimal mucosal thickening in the ethmoid air cells and inferior left frontal sinus. The mastoid air cells are clear.  IMPRESSION: 1. Acute nonhemorrhagic infarct involving the posterior left lentiform nucleus extending superiorly into the corona radiata. 2. Additional remote lacunar infarcts are present in the basal ganglia, brainstem, inferior cerebellum bilaterally. 3. Atrophy and diffuse white matter changes are present bilaterally including the area of acute infarct. This likely reflects the sequelae of chronic microvascular ischemia. 4. Minimal sinus disease. These results were called by telephone at the time of interpretation on 10/12/2013 at 11:55 AM to Dr. Loretha BrasilZEYLIKMAN, who verbally acknowledged these results.   Electronically Signed   By: Gennette Pachris  Mattern M.D.   On: 10/12/2013 11:58   Dg Chest Port 1 View  10/16/2013   CLINICAL DATA:  NG tube placement.  EXAM: PORTABLE CHEST - 1 VIEW  COMPARISON:  One-view chest 10/14/2013.  FINDINGS: The NG tube courses off the inferior  border of the film. Heart size is within normal limits. Atherosclerotic calcifications are again noted at the aortic arch. Aeration of both lungs is markedly improved. Mild pulmonary vascular congestion persists. No significant focal airspace disease is present. The visualized soft tissues and bony thorax are unremarkable.  IMPRESSION: 1. The NG tube courses off the inferior border of the film. 2. Significantly improved airspace disease with some residual mild pulmonary vascular congestion.   Electronically Signed   By: Gennette Pachris  Mattern M.D.   On: 10/16/2013 20:19   Dg Chest Port 1 View  10/14/2013   CLINICAL DATA:  Respiratory distress.  EXAM: PORTABLE CHEST - 1 VIEW  COMPARISON:  There prior  FINDINGS: Cardiac silhouette remains mildly enlarged, mediastinal silhouette is nonsuspicious, calcified aortic knob. Diffuse interstitial prominence increased from prior examination with patchy bibasilar airspace opacities. Small right pleural effusion. No pneumothorax.  Patient is osteopenic. Left antecubital intravenous catheter in place. Multiple EKG lines overlie the patient and may obscure subtle underlying pathology.  IMPRESSION: Stable cardiomegaly with increasing interstitial prominence suggesting pulmonary edema, mild patchy airspace opacity lung bases may reflect confluent edema, atelectasis, less likely pneumonia with small right pleural effusion.   Electronically Signed   By: Awilda Metroourtnay  Bloomer   On: 10/14/2013 05:03   Dg Abd Portable 1v  10/17/2013   CLINICAL DATA:  Nasogastric tube placement.  EXAM: PORTABLE ABDOMEN - 1 VIEW  COMPARISON:  DG ABD PORTABLE 1V dated 10/16/2013  FINDINGS: The bowel gas pattern is normal. No radio-opaque calculi or other significant radiographic abnormality are seen. Nasogastric tube tip lies along the greater curvature the stomach. The last side hole is located near the GE junction. Tube advancement of at least 3 cm would be more optimal.  IMPRESSION: Nasogastric tube placement as  described.   Electronically Signed   By: Davonna BellingJohn  Curnes M.D.   On: 10/17/2013 22:41   Dg Abd Portable 1v  10/17/2013   CLINICAL DATA:  NG tube placement  EXAM: PORTABLE ABDOMEN - 1 VIEW  COMPARISON:  None.  FINDINGS: Nasogastric tube terminates in the proximal stomach. The side port is at the GE junction. The upper abdominal bowel gas pattern is unremarkable. Mild atelectasis in the retrocardiac  lung.  IMPRESSION: Nasogastric tube which ends in the proximal stomach. The side port is at the GE junction, advancement by 3 cm may improve catheter function.   Electronically Signed   By: Tiburcio Pea M.D.   On: 10/17/2013 06:07    Medications: Scheduled Meds: . antiseptic oral rinse  15 mL Mouth Rinse BID  . aspirin  300 mg Rectal Daily  . cloNIDine  0.2 mg Transdermal Weekly  . diltiazem  60 mg Oral QID  . famotidine  20 mg Oral Daily  . free water  200 mL Per Tube Q6H  . heparin  5,000 Units Subcutaneous 3 times per day  . insulin aspart  0-15 Units Subcutaneous 6 times per day  . metoprolol tartrate  25 mg Oral BID      LOS: 7 days   RAI,RIPUDEEP M.D. [facility] 10/18/2013, 11:15 AM Pager: [phone]  If 7PM-7AM, please contact night-coverage [email or web address] Password [email or web address]

## 2013-10-19 DIAGNOSIS — N289 Disorder of kidney and ureter, unspecified: Secondary | ICD-10-CM

## 2013-10-19 DIAGNOSIS — I69991 Dysphagia following unspecified cerebrovascular disease: Secondary | ICD-10-CM

## 2013-10-19 LAB — GLUCOSE, CAPILLARY
Glucose-Capillary: 164 mg/dL — ABNORMAL HIGH (ref 70–99)
Glucose-Capillary: 167 mg/dL — ABNORMAL HIGH (ref 70–99)
Glucose-Capillary: 151 mg/dL — ABNORMAL HIGH (ref 70–99)
Glucose-Capillary: 189 mg/dL — ABNORMAL HIGH (ref 70–99)
Glucose-Capillary: 142 mg/dL — ABNORMAL HIGH (ref 70–99)

## 2013-10-19 MED ORDER — ASPIRIN 81 MG PO CHEW
81.0000 mg | CHEWABLE_TABLET | Freq: Every day | ORAL | Status: DC
  Administered 2013-10-19 – 2013-10-20 (×2): 81 mg via NASOGASTRIC
  Filled 2013-10-19 (×2): qty 1

## 2013-10-19 NOTE — Clinical Social Work Psychosocial (Signed)
Clinical Social Work Department BRIEF PSYCHOSOCIAL ASSESSMENT 10/19/2013  Patient:  [name],Ji     Account Number:  [id number]     Admit date:  10/11/2013  Clinical Social Worker:  Earnestine LeysPatrick-jefferson,Varonica Siharath, LCSWA  Date/Time:  10/19/2013 03:21 PM  Referred by:  Physician  Date Referred:  10/19/2013 Referred for  SNF Placement   Other Referral:   Interview type:  Family Other interview type:   Clinical Social Worker spoke with patient's daughter Hedy Camara([name] Bryan [phone]) via telephone.    PSYCHOSOCIAL DATA Living Status:  ALONE Admitted from facility:   Level of care:   Primary support name:  Hedy CamaraLori Bryan Primary support relationship to patient:  CHILD, ADULT Degree of support available:   Good    CURRENT CONCERNS Current Concerns  Post-Acute Placement  Other - See comment   Other Concerns:   Patient's daughter expressed concerns regarding palliative care (comfort) care for patient.    SOCIAL WORK ASSESSMENT / PLAN Clinical Social Worker (CSW) received consult for follow-up. CSW spoke with patient's daughter via telephone. CSW introduced self and explained role. CSW discussed D/C plan with patient's daughter. Per daughter, patient was living home alone, until she was found unresponsive the previous weekend. Daughter stated she lives next door to patient and checked in on her often. Daughter denied patient requiring any assistance with ADL. Patient's daughter reported meeting with Dr. Ladona Ridgelaylor (Palliative Care) today and feels she needs more time to consider the option of palliative care before making any decision about SNF placement. CSW verbalized understanding of the above. CSW to continue to provide follow-up.   Assessment/plan status:  Psychosocial Support/Ongoing Assessment of Needs Other assessment/ plan:   Patient's daughter to meet with Palliative Care to discuss possible d/c plan on 10/20/13.   Information/referral to community resources:    PATIENT'S/FAMILY'S RESPONSE TO  PLAN OF CARE: Patient's daughter thanked CSW for assisting with D/C plan and easing overwhelming feelings. Patient's daughter reported she needs time to consider her options and is thankful to all members of the treatment team for allowing her such time.    Zailey Audia Patrick-Jefferson, LCSWA Weekend Clinical Social Worker [phone]

## 2013-10-19 NOTE — Progress Notes (Signed)
Patient ID: Wendy Malone  female  BJY:[id number]    DOB: 03-26-1941    DOA: 10/11/2013  PCP: No primary provider on file.  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   Dysphagia  - GI consult called for anemia workup and possible PEG tube placement. Prognosis for full recovery is poor and will need long-term placement. -  Patient has been seen by GI and not feel any workup for her anemia is needed at this time and recommended placement of a panda tube - Patient was started on dysphagia diet, and did not do well with the diet, NG tube was placed however she pulled it out last night it was re-inserted. - Dr Isidoro Donning discussed in detail with her daughter,Ms Hedy Camara who feels frustrated with all the issues and feels that her mother never went to doctors, she probably never wanted all this or PEG tubes. - she has spoken with Palliative care today but has not made any further decisions for comfort care today- she would like to be consulted if pt pulls out NG again- she is in restraints for now  Uncontrolled hypertension  - Placed on clonidine patch, on scheduled metoprolol, Cardizem on IV labetalol and hydralazine as needed   Acute respiratory failure  - Resolved, secondary to pulmonary edema  - 2-D echo was done 3/1 which had shown EF of 60-65% with grade 1 diastolic dysfunction   Atrial fibrillation  - Paroxysmal, currently in sinus rhythm, started on Po metoprolol and cardizem  - Currently on aspirin  PR, per cardiology, will need to be anticoagulated when cleared by neurology  - also workup for anemia prior to starting anticoagulation   Anemia  - B12 folate normal, 42, ferritin 92  - Dr Isidoro Donning had discussed in detail with the patient's daughter on 3/2, her mother did not want any invasive procedures/colonoscopy when her primary care physician had suggested In December 2014   Acute kidney failure  -Creatinine is now improved with IV fluids, but lasix given again on 3/6 for mild fluid overload- BUN Cr ratio rising again- for now cont free water through NG  Hypernatremia  Continues- cont Free water 200 ml q 6 hrs   DVT Prophylaxis:  Code Status: DO NOT RESUSCITATE   Family Communication: I did not meet with family today Disposition:  Consultants:  Cardiology    palliative medicine    Subjective: Sleepy - awakens and grunts when I ask if she is doing OK- no other communication noted. Will not attempt to answer other questions  Objective: Weight change:   Intake/Output Summary (Last 24 hours) at 10/19/13 1626 Last data filed at 10/19/13 0500  Gross per 24 hour  Intake     80 ml  Output    600 ml  Net   -520 ml   Blood pressure 163/52, pulse 62, temperature 98.9 F (37.2 C), temperature source Axillary, resp. rate 16, height 5' (1.524 m), weight 67 kg (147 lb 11.3 oz), SpO2 96.00%.  Physical Exam: General: no distress HEENT: anicteric sclera, PERLA, EOMI CVS: S1-S2 clear, no murmur rubs or gallops  Chest:  mild scattered wheezing  Abdomen: soft nontender, nondistended, normal bowel sounds  Extremities: no cyanosis, clubbing or edema noted bilaterally    Lab Results: Basic Metabolic Panel:  Recent Labs Lab 10/17/13 0450 10/18/13 0527  NA 155* 152*  K 3.5* 3.4*  CL 117* 114*  CO2 24 23  GLUCOSE 212* 208*  BUN 70* 88*  CREATININE 1.91* 2.04*  CALCIUM 9.1 9.4  MG 3.0*  --    Liver Function Tests:  Recent Labs Lab 10/17/13 0450  AST 22  ALT 36*    ALKPHOS 48  BILITOT 0.4  PROT 5.6*  ALBUMIN 2.7*   No results found for this basename: LIPASE, AMYLASE,  in the last 168 hours No results found for this basename: AMMONIA,  in the last 168 hours CBC:  Recent Labs Lab 10/17/13 0450 10/18/13 0527  WBC 5.0 6.8  HGB 8.0* 9.8*  HCT 25.4* 30.5*  MCV 93.4 92.4  PLT 200 237   Cardiac Enzymes: No results found for this basename: CKTOTAL, CKMB, CKMBINDEX, TROPONINI,  in the last 168 hours BNP: No components found with this basename: POCBNP,  CBG:  Recent Labs Lab 10/18/13 1645 10/18/13 2016 10/19/13 0129 10/19/13 0334 10/19/13 1002  GLUCAP 143* 147* 164* 167* 151*     Micro Results: Recent Results (from the past 240 hour(s))  MRSA PCR SCREENING     Status: None   Collection Time    10/11/13  9:40 PM      Result Value Ref Range Status   MRSA by PCR NEGATIVE  NEGATIVE Final   Comment:            The GeneXpert MRSA Assay (FDA     approved for NASAL specimens     only), is one component of a     comprehensive MRSA colonization     surveillance program. It is not     intended to diagnose MRSA     infection nor to guide or     monitor treatment for     MRSA infections.    Studies/Results: Ct Angio Head W/cm &/or Wo Cm  10/11/2013   CLINICAL DATA:  Code stroke. Right-sided weakness and slurred speech.  EXAM: CT ANGIOGRAPHY HEAD  TECHNIQUE: Multidetector CT imaging of the head was performed using the standard protocol during bolus administration of intravenous contrast. Multiplanar CT image reconstructions and MIPs were obtained to evaluate the vascular anatomy.  CONTRAST:  50mL OMNIPAQUE IOHEXOL 350 MG/ML SOLN  COMPARISON:  None.  FINDINGS: Gray-white differentiation is preserved the source images and postcontrast images without evidence for a focal cortical infarct. There are multiple lacunar infarcts within the basal ganglia bilaterally which are age-indeterminate. Extensive periventricular white matter hypoattenuation is  present bilaterally.  No hemorrhage or mass lesion is evident.  The cervical internal carotid arteries are within normal limits bilaterally. Calcified and noncalcified plaque is present within the cavernous carotid arteries bilaterally. There is a moderate stenosis of the right cavernous carotid artery just proximal to the genu. Mild to moderate narrowing is present in the supra clinoid left internal carotid artery. Supra clinoid segments are normal bilaterally. The A1 and M1 segments are normal bilaterally. The anterior communicating artery is patent.  Segmental narrowing within the pericallosal arteries is worse on the right. There is segmental narrowing of distal MCA branch vessels bilaterally, right greater than left.  The left vertebral artery is dominant. The right PICA origin is visualized and normal. The left AICA is dominant. The basilar artery is  within normal limits. Both posterior cerebral arteries originate from the basilar tip. Segmental stenoses are present in the proximal PCA branch vessels bilaterally. The dural sinuses are patent.  Review of the MIP images confirms the above findings.  IMPRESSION: 1. No acute or subacute cortical infarct is evident. 2. Bilateral basal ganglia infarcts are age indeterminate. 3. 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 4. Segmental stenoses within the PCA branch vessels bilaterally and right greater than left MCA branch vessels. 5. Segmental stenoses within the pericallosal arteries, right greater than left.   Electronically Signed   By: Gennette Pac M.D.   On: 10/11/2013 21:31   Dg Chest 2 View  10/11/2013   CLINICAL DATA:  Stroke.  EXAM: CHEST  2 VIEW  COMPARISON:  None.  FINDINGS: Cardiac silhouette is mildly enlarged. Normal mediastinal and hilar contours.  There are prominent bronchovascular markings. Lungs are otherwise clear. No pleural effusion or pneumothorax.  The bony thorax is intact.  IMPRESSION: No acute  cardiopulmonary disease.   Electronically Signed   By: Amie Portland M.D.   On: 10/11/2013 21:03   Ct Head Wo Contrast  10/12/2013   CLINICAL DATA:  Altered mental status.  Unable to speak.  EXAM: CT HEAD WITHOUT CONTRAST  TECHNIQUE: Contiguous axial images were obtained from the base of the skull through the vertex without intravenous contrast.  COMPARISON:  CTA of the head performed 10/11/2013  FINDINGS: There is no evidence of acute infarction, mass lesion, or intra- or extra-axial hemorrhage on CT.  Diffuse periventricular and subcortical white matter change likely reflects small vessel ischemic microangiopathy. Scattered chronic lacunar infarcts are seen within the basal ganglia bilaterally.  The posterior fossa, including the cerebellum, brainstem and fourth ventricle, is within normal limits. The third and lateral ventricles are unremarkable in appearance. The cerebral hemispheres demonstrate grossly normal gray-white differentiation. No mass effect or midline shift is seen.  There is no evidence of fracture; visualized osseous structures are unremarkable in appearance. The visualized portions of the orbits are within normal limits. The paranasal sinuses and mastoid air cells are well-aerated. No significant soft tissue abnormalities are seen.  IMPRESSION: 1. No acute intracranial pathology seen on CT. 2. Diffuse small vessel ischemic microangiopathy again noted; scattered chronic lacunar infarcts within the basal ganglia bilaterally.   Electronically Signed   By: Roanna Raider M.D.   On: 10/12/2013 03:26   Mr Brain Wo Contrast  10/12/2013   CLINICAL DATA:  Slurred speech. Ischemic stroke. Right hemiparesis and facial droop.  EXAM: MRI HEAD WITHOUT CONTRAST  TECHNIQUE: Multiplanar, multiecho pulse sequences of the brain and surrounding structures were obtained without intravenous contrast.  COMPARISON:  CT head without contrast 10/12/2013.  FINDINGS: Diffusion-weighted images demonstrate an acute  nonhemorrhagic infarct involving the posterior left lentiform nucleus extending into the body of caudate and coronal radiata. The area measures 3.2 x 2.5 x 1.4 cm. The area is present within the region of diffuse white matter disease. Advanced atrophy and extensive white matter changes are present bilaterally. Remote lacunar infarcts are present in the brainstem and inferior cerebellum bilaterally. Remote lacunar infarcts are present in the basal ganglia bilaterally as well.  Flow is present in the major intracranial arteries. The globes and orbits are intact. Mild mucosal thickening is present in the sphenoid sinuses bilaterally. There is minimal mucosal thickening in the ethmoid air cells and inferior left frontal sinus. The mastoid air cells are clear.  IMPRESSION: 1. Acute nonhemorrhagic infarct involving the posterior  left lentiform nucleus extending superiorly into the corona radiata. 2. Additional remote lacunar infarcts are present in the basal ganglia, brainstem, inferior cerebellum bilaterally. 3. Atrophy and diffuse white matter changes are present bilaterally including the area of acute infarct. This likely reflects the sequelae of chronic microvascular ischemia. 4. Minimal sinus disease. These results were called by telephone at the time of interpretation on 10/12/2013 at 11:55 AM to Dr. Loretha BrasilZEYLIKMAN, who verbally acknowledged these results.   Electronically Signed   By: Gennette Pachris  Mattern M.D.   On: 10/12/2013 11:58   Dg Chest Port 1 View  10/16/2013   CLINICAL DATA:  NG tube placement.  EXAM: PORTABLE CHEST - 1 VIEW  COMPARISON:  One-view chest 10/14/2013.  FINDINGS: The NG tube courses off the inferior border of the film. Heart size is within normal limits. Atherosclerotic calcifications are again noted at the aortic arch. Aeration of both lungs is markedly improved. Mild pulmonary vascular congestion persists. No significant focal airspace disease is present. The visualized soft tissues and bony thorax are  unremarkable.  IMPRESSION: 1. The NG tube courses off the inferior border of the film. 2. Significantly improved airspace disease with some residual mild pulmonary vascular congestion.   Electronically Signed   By: Gennette Pachris  Mattern M.D.   On: 10/16/2013 20:19   Dg Chest Port 1 View  10/14/2013   CLINICAL DATA:  Respiratory distress.  EXAM: PORTABLE CHEST - 1 VIEW  COMPARISON:  There prior  FINDINGS: Cardiac silhouette remains mildly enlarged, mediastinal silhouette is nonsuspicious, calcified aortic knob. Diffuse interstitial prominence increased from prior examination with patchy bibasilar airspace opacities. Small right pleural effusion. No pneumothorax.  Patient is osteopenic. Left antecubital intravenous catheter in place. Multiple EKG lines overlie the patient and may obscure subtle underlying pathology.  IMPRESSION: Stable cardiomegaly with increasing interstitial prominence suggesting pulmonary edema, mild patchy airspace opacity lung bases may reflect confluent edema, atelectasis, less likely pneumonia with small right pleural effusion.   Electronically Signed   By: Awilda Metroourtnay  Bloomer   On: 10/14/2013 05:03   Dg Abd Portable 1v  10/17/2013   CLINICAL DATA:  Nasogastric tube placement.  EXAM: PORTABLE ABDOMEN - 1 VIEW  COMPARISON:  DG ABD PORTABLE 1V dated 10/16/2013  FINDINGS: The bowel gas pattern is normal. No radio-opaque calculi or other significant radiographic abnormality are seen. Nasogastric tube tip lies along the greater curvature the stomach. The last side hole is located near the GE junction. Tube advancement of at least 3 cm would be more optimal.  IMPRESSION: Nasogastric tube placement as described.   Electronically Signed   By: Davonna BellingJohn  Curnes M.D.   On: 10/17/2013 22:41   Dg Abd Portable 1v  10/17/2013   CLINICAL DATA:  NG tube placement  EXAM: PORTABLE ABDOMEN - 1 VIEW  COMPARISON:  None.  FINDINGS: Nasogastric tube terminates in the proximal stomach. The side port is at the GE junction. The  upper abdominal bowel gas pattern is unremarkable. Mild atelectasis in the retrocardiac lung.  IMPRESSION: Nasogastric tube which ends in the proximal stomach. The side port is at the GE junction, advancement by 3 cm may improve catheter function.   Electronically Signed   By: Tiburcio PeaJonathan  Watts M.D.   On: 10/17/2013 06:07    Medications: Scheduled Meds: . antiseptic oral rinse  15 mL Mouth Rinse BID  . aspirin  81 mg Per NG tube Daily  . cloNIDine  0.2 mg Transdermal Weekly  . diltiazem  60 mg Oral QID  . famotidine  20 mg Oral Daily  . free water  200 mL Per Tube Q6H  . heparin  5,000 Units Subcutaneous 3 times per day  . insulin aspart  0-15 Units Subcutaneous 6 times per day  . metoprolol tartrate  25 mg Oral BID      LOS: 8 days   [facility] M.D. [facility] 10/19/2013, 4:26 PM Pager: [phone]  If 7PM-7AM, please contact night-coverage [email or web address] Password [email or web address]

## 2013-10-19 NOTE — Consult Note (Signed)
Patient ID:Keyauna Gunnels      DOB: March 18, 1941      CMM:[id number]  Summary of Goals of Care; full note to follow:  Met with patient's only child [name].  [name] expresses that she does not believe that her mother would want to live like this but she is struggling with making that final choice which she believes will likely be for comfort care with hospice facility placement. She wants to think on this over night , talk with her son and read the literature that I provided before making final choice.  For now honor DNR, continue tube feeding. Please consult with [name] if Debrina pulls out her tube.  [name] should be given the option to ask for it not to be replaced.   Will remeet with [name] tomorrow    Total time 115- 1230 pm  Elick Aguilera L. Lovena Le, MD MBA The Palliative Medicine Team at [facility] Phone: [phone] Pager: [phone]

## 2013-10-19 NOTE — Consult Note (Signed)
Patient ID:Wendy Malone      DOB: [birth year]      JXB:[id number]     Consult Note from the Palliative Medicine Team at [place] Requested by:  Dr.  Tana Coast    PCP: No primary provider on file. Reason for Consultation:Goals of CA[place]     Phone Number:None Related symptom recommendations. Assessment of patients Current state: 73 yr old found down at home with  hemiparesis and speech deficits.  Subsequently developed worsened cognitive status and dense hemiparesis. CT initially negative for stroke but MRI confirmed stroke involving the posterior left lentiform nucleus extending superiorly into the corona radiata.  I met with the patient's daughter who is her only surrogate.  She is struggling with what to do next. [name] states her mother would not go to the doctor and she has always been very stubborn about taking care of herself. [name] expressed some guilt over not making her take care of herself.  She recognizes that her mother would not just want to exist and that she would not really want artificial feeding.  She felt that she needed time to process what her decision would be next.  We talked about pros and cons of PEG feeding and about hospice and comfort care.  I gave her a Visual merchandiser.   Goals of Care: 1.  Code Status: DNR   2. Scope of Treatment:  For now continue tube feedings, and treatment for afib, antibiotics and other curative care should continue.   4. Disposition: to be determined   3. Symptom Management:   1. Afib : continue with current meds 2. Dysphagia: continue tube feeding.  4. Psychosocial: lived alone, did not go to doctors, worked at [employer] one daughter, one grandson  5. Spiritual: not a Banker.        Patient Documents Completed or Given: Document Given Completed  Advanced Directives Pkt    MOST    DNR    Gone from My Sight    Hard Choices      Brief HPI: 73 yr old white female who lived independently next to her daughter.   Patient had been having spells of confusion. Daughter drove by her house and noted the her lights and blinds were not up for the day. She returned and found her on the floor.  Patient was diagnosed with significant stroke. I was asked to assist with Goals of care.   ROS:  Not able to verbalize.    PMH:  Past Medical History  Diagnosis Date  . Hypertension     since December 2014  . GERD (gastroesophageal reflux disease)   . Macular degeneration      ZHY:QMVHQIO reviewed. No pertinent past surgical history. I have reviewed the [place] and SH and  If appropriate update it with new information. No Known Allergies Scheduled Meds: . antiseptic oral rinse  15 mL Mouth Rinse BID  . aspirin  81 mg Per NG tube Daily  . cloNIDine  0.2 mg Transdermal Weekly  . diltiazem  60 mg Oral QID  . famotidine  20 mg Oral Daily  . free water  200 mL Per Tube Q6H  . heparin  5,000 Units Subcutaneous 3 times per day  . insulin aspart  0-15 Units Subcutaneous 6 times per day  . metoprolol tartrate  25 mg Oral BID   Continuous Infusions: . feeding supplement (OSMOLITE 1.2 CAL) 1,000 mL (10/19/13 0600)   PRN Meds:.hydrALAZINE, ipratropium, labetalol, levalbuterol, levalbuterol, metoprolol,  RESOURCE THICKENUP CLEAR, senna-docusate    BP 163/52  Pulse 62  Temp(Src) 98.9 F (37.2 C) (Axillary)  Resp 16  Ht 5' (1.524 m)  Wt 67 kg (147 lb 11.3 oz)  BMI 28.85 kg/m2  SpO2 96%   PPS: 10 %   Intake/Output Summary (Last 24 hours) at 10/19/13 1301 Last data filed at 10/19/13 0500  Gross per 24 hour  Intake    360 ml  Output    600 ml  Net   -240 ml     Physical Exam:  General:  Barely responsive, will open eyes when stimulated but goes right back to sleep, agitated when awake, HEENT:  PERRL, EOMI, anciteric, mmdry NGT in place Chest:   Decreased with some rhonchi at the bases CVS: irregular, S1, S2 no murmur Abdomen: soft, not tender, positive bowel sounds Ext: dense right  hemiparesis Neuro:dense right hemiparesis, dysarthria, mixed expressive receptive aphasia.  Labs: CBC    Component Value Date/Time   WBC 6.8 10/18/2013 0527   RBC 3.30* 10/18/2013 0527   RBC 2.76* 10/12/2013 1450   HGB 9.8* 10/18/2013 0527   HCT 30.5* 10/18/2013 0527   PLT 237 10/18/2013 0527   MCV 92.4 10/18/2013 0527   MCH 29.7 10/18/2013 0527   MCHC 32.1 10/18/2013 0527   RDW 16.2* 10/18/2013 0527   LYMPHSABS 1.0 10/11/2013 1857   MONOABS 0.8 10/11/2013 1857   EOSABS 0.0 10/11/2013 1857   BASOSABS 0.0 10/11/2013 1857       CMP     Component Value Date/Time   NA 152* 10/18/2013 0527   K 3.4* 10/18/2013 0527   CL 114* 10/18/2013 0527   CO2 23 10/18/2013 0527   GLUCOSE 208* 10/18/2013 0527   BUN 88* 10/18/2013 0527   CREATININE 2.04* 10/18/2013 0527   CALCIUM 9.4 10/18/2013 0527   PROT 5.6* 10/17/2013 0450   ALBUMIN 2.7* 10/17/2013 0450   AST 22 10/17/2013 0450   ALT 36* 10/17/2013 0450   ALKPHOS 48 10/17/2013 0450   BILITOT 0.4 10/17/2013 0450   GFRNONAA 24* 10/18/2013 0527   GFRAA 27* 10/18/2013 0527    Chest Xray Reviewed/Impressions: Nasogastric tube which ends in the proximal stomach. The side port  is at the GE junction, advancement by 3 cm may improve catheter  function   CT scan of the Head Reviewed/Impressions: 1. No acute intracranial pathology seen on CT.  2. Diffuse small vessel ischemic microangiopathy again noted;  scattered chronic lacunar infarcts within the basal ganglia  bilaterally.   MRI  1. Acute nonhemorrhagic infarct involving the posterior left  lentiform nucleus extending superiorly into the corona radiata.  2. Additional remote lacunar infarcts are present in the basal  ganglia, brainstem, inferior cerebellum bilaterally.  3. Atrophy and diffuse white matter changes are present bilaterally  including the area of acute infarct. This likely reflects the  sequelae of chronic microvascular ischemia.  4. Minimal sinus disease.  These results were called by telephone at the time of  interpretation  on 10/12/2013 at 11:55 AM to Dr. Irish Elders, who verbally acknowledged  these results   Time In Time Out Total Time Spent with Patient Total Overall Time  115 am 1230 pm 65 min 65 min    Greater than 50%  of this time was spent counseling and coordinating care related to the above assessment and plan.   Wasil Wolke L. Lovena Le, MD MBA The Palliative Medicine Team at [facility] Phone: [phone] Pager: [phone]

## 2013-10-20 DIAGNOSIS — E870 Hyperosmolality and hypernatremia: Secondary | ICD-10-CM

## 2013-10-20 DIAGNOSIS — Z515 Encounter for palliative care: Secondary | ICD-10-CM

## 2013-10-20 LAB — BASIC METABOLIC PANEL
BUN: 75 mg/dL — ABNORMAL HIGH (ref 6–23)
CO2: 23 mEq/L (ref 19–32)
Calcium: 8.2 mg/dL — ABNORMAL LOW (ref 8.4–10.5)
Chloride: 115 mEq/L — ABNORMAL HIGH (ref 96–112)
Creatinine, Ser: 1.55 mg/dL — ABNORMAL HIGH (ref 0.50–1.10)
GFR calc Af Amer: 38 mL/min — ABNORMAL LOW (ref >90)
GFR, EST NON AFRICAN AMERICAN: 33 mL/min — AB (ref >90)
GLUCOSE: 190 mg/dL — AB (ref 70–99)
POTASSIUM: 3.7 meq/L (ref 3.7–5.3)
Sodium: 152 mEq/L — ABNORMAL HIGH (ref 137–147)

## 2013-10-20 LAB — GLUCOSE, CAPILLARY
GLUCOSE-CAPILLARY: 118 mg/dL — AB (ref 70–99)
GLUCOSE-CAPILLARY: 133 mg/dL — AB (ref 70–99)
GLUCOSE-CAPILLARY: 144 mg/dL — AB (ref 70–99)
GLUCOSE-CAPILLARY: 145 mg/dL — AB (ref 70–99)
GLUCOSE-CAPILLARY: 182 mg/dL — AB (ref 70–99)
Glucose-Capillary: 146 mg/dL — ABNORMAL HIGH (ref 70–99)

## 2013-10-20 MED ORDER — METOPROLOL TARTRATE 1 MG/ML IV SOLN
5.0000 mg | Freq: Four times a day (QID) | INTRAVENOUS | Status: DC
  Administered 2013-10-20 – 2013-10-22 (×5): 5 mg via INTRAVENOUS
  Filled 2013-10-20 (×15): qty 5

## 2013-10-20 MED ORDER — LORAZEPAM 2 MG/ML IJ SOLN: 0.5000 mg | INTRAMUSCULAR | Status: DC | PRN

## 2013-10-20 MED ORDER — METOPROLOL TARTRATE 1 MG/ML IV SOLN: 2.5000 mg | INTRAVENOUS | Status: DC | PRN

## 2013-10-20 MED ORDER — MORPHINE SULFATE (CONCENTRATE) 10 MG /0.5 ML PO SOLN: 5.0000 mg | ORAL | Status: DC | PRN

## 2013-10-20 NOTE — Progress Notes (Signed)
Patient ID:Wendy Malone      DOB: 01/28/1941      MRN:[id number]   Palliative Medicine Team at [place] Progress Note    Subjective:  Met with patient's daughter who was able to talk with her son about his grandmother's condition.  They are in agreement that she would not want to live like this.  Wendy Malone has decided that she would like to discontinue tube feedings and other meds that are not able to be given or are not their to promote comfort and request transition to hospice facility in [place] in the am.      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  Daughter decided to stop feeding tub and transitio to comfort with hopeful move to [facility] when bed available.  1.  DNR  2. D/C panda and feeding tube  3. Stop meds by mouth  , use iv metoprolol for now to control afib  4.  Leave roxanol prn and ativan prn for symptoms of shortness of breath pain or anxiety.  Discussed with dr. Rai.  Total time 535-615 pm  Wendy L. Taylor, MD MBA The Palliative Medicine Team at [place] Team Phone: [phone] Pager: [phone]  

## 2013-10-20 NOTE — Progress Notes (Signed)
Rehab admissions - Noted patient now with feeding tube.  Has restraints as well.  Not making progress with therapies.  Noted daughter having difficulty with decisions.  At this point, it looks like SNF will be needed with a PEG unless palliative makes other decision.  I spoke with PT today.  If patient does better today with PT, could re visit inpatient rehab admission.  Likely will need SNF.  Call me for questions.  #[phone]

## 2013-10-20 NOTE — Progress Notes (Signed)
Physical Therapy Treatment Patient Details Name: Wendy Malone MRN: [id number] DOB: 1941/01/14 Today's Date: 10/20/2013 Time: [id number] PT Time Calculation (min): 25 min  PT Assessment / Plan / Recommendation  History of Present Illness 73 yo with HTN presenting 2/28 with right hemiparesis and facial droop PMHx macular degeneration (Lt eye worse than Rt per daughter).  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  Pt also having episodes of A-fib with RVR rates into the 150s during her stay.     PT Comments   Pt is unfortunately not progressing as quickly as hoped.  She will likely need much longer rehabilitation and is now more appropriate for SNF level therapies.  She is maintaining NSR with rate in the 70s and has had tube feeds on board since late last week.  PT will continue to follow acutely.    Follow Up Recommendations  SNF     Does the patient have the potential to tolerate intense rehabilitation    NA  Barriers to Discharge   Daughter works and is a single mom and cannot provide 24/7 care at discharge.        Equipment Recommendations  Wheelchair (measurements PT);Wheelchair cushion (measurements PT);Hospital bed;Other (comment) (hoyer lift)    Recommendations for Other Services   None  Frequency Min 3X/week   Progress towards PT Goals Progress towards PT goals: Progressing toward goals (very slowly)  Plan Discharge plan needs to be updated;Frequency needs to be updated    Precautions / Restrictions Precautions Precautions: Fall Precaution Comments: Pt with right sided weakness, left gaze preference, has been moving in and out of A-fib with RVR, monitor HR Restrictions Weight Bearing Restrictions: No   Pertinent Vitals/Pain See vitals flow sheet.     Mobility  Bed  Mobility Overal bed mobility: Needs Assistance Bed Mobility: Supine to Sit Supine to sit: +2 for physical assistance;Max assist General bed mobility comments: Once movement started, pt did help initiate pull to sitting by using her left arm to pull against therapist.   Transfers Overall transfer level: Needs assistance Equipment used: None Transfers: Squat Pivot Transfers Squat pivot transfers: +2 physical assistance;Total assist General transfer comment: Total support of trunk and legs in squatting towards the left side.  Pt did attempt to hold to therapist with multiple manual and verbal cues with left hand, but did not seem to take weight as she had in the past on her left foot.  Pt did less than 10%.   Modified Rankin (Stroke Patients Only) Pre-Morbid Rankin Score: No symptoms Modified Rankin: Severe disability      PT Goals (current goals can now be found in the care plan section) Acute Rehab PT Goals Patient Stated Goal: uhable to state.  Daughter would like for her to go to CIR level therapies at d/c from acute hospital setting.   Visit Information  Last PT Received On: 10/20/13 Assistance Needed: +2 PT/OT/SLP Co-Evaluation/Treatment: Yes Reason for Co-Treatment: Complexity of the patient's impairments (multi-system involvement);For patient/therapist safety PT goals addressed during session: Mobility/safety with mobility;Balance;Strengthening/ROM OT goals addressed during session: ADL's and self-care History of Present Illness: 73 yo with HTN presenting 2/28 with right hemiparesis and facial droop PMHx macular degeneration (Lt eye worse than Rt per daughter).  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  Pt also having episodes of A-fib with RVR rates into the 150s during  her stay.      Subjective Data  Subjective: Pt moaning, stating one word responses at times that seem appropriate.   Patient Stated Goal: uhable to state.  Daughter would like for her to go to CIR level therapies at d/c from acute hospital setting.    Cognition  Cognition Arousal/Alertness: Lethargic Behavior During Therapy: Flat affect Overall Cognitive Status: Impaired/Different from baseline Area of Impairment: Attention;Following commands;Safety/judgement;Awareness;Problem solving Orientation Level: Disoriented to;Situation Current Attention Level: Focused Memory: Decreased short-term memory Following Commands: Follows one step commands inconsistently;Follows one step commands with increased time Safety/Judgement: Decreased awareness of safety;Decreased awareness of deficits Awareness: Intellectual (if any) Problem Solving: Slow processing;Decreased initiation;Requires verbal cues;Requires tactile cues General Comments: Pt preforms more automatic tasks today.  Once movement initiated, she would initiate, when given wash cloth in left hand she brought it up to her face.   Difficult to assess due to: Impaired communication    Balance  Balance Overall balance assessment: Needs assistance Sitting-balance support: Feet supported;Bilateral upper extremity supported Sitting balance-Leahy Scale: Zero Sitting balance - Comments: Pt with two person total assist in sitting.  Attempting to push right with strong left hand.  Very poor trunk support.  Pt did best with sitting balance with left hand on her knee.  In sitting worked on self care tasks (see OT note), sitting balance, and decreasing pushing to the right.   Postural control: Right lateral lean Standing balance support: Single extremity supported Standing balance-Leahy Scale: Zero Standing balance comment: two person total assist to squat pivot to chair.  General Comments General comments (skin integrity, edema, etc.): HR in NSR 74  bpm at beginning of session.    End of Session PT - End of Session Equipment Utilized During Treatment: Gait belt Activity Tolerance: Patient limited by fatigue;Patient limited by lethargy Patient left: in chair;with call bell/phone within reach;with restraints reapplied (mitten and glove) Nurse Communication: Mobility status;Need for lift equipment (to RN tech)     Rollene Rotunda. Yunique Dearcos, PT, DPT [phone]   10/20/2013, 11:23 AM

## 2013-10-20 NOTE — Progress Notes (Signed)
Occupational Therapy Treatment Patient Details Name: Wendy Malone MRN: [id number] DOB: [birth year] Today's Date: 10/20/2013 Time: [id number] OT Time Calculation (min): 23 min  OT Assessment / Plan / Recommendation  History of present illness 73 yo with HTN presenting 2/28 with right hemiparesis and facial droop PMHx macular degeneration (Lt eye worse than Rt per daughter).  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  Pt also having episodes of A-fib with RVR rates into the 150s during her stay.     OT comments  Pt very lethargic today therefore active participation limited.  Pt continues to require total assist with most mobility and adls at this time.    Follow Up Recommendations  SNF;Supervision/Assistance - 24 hour    Barriers to Discharge       Equipment Recommendations  3 in 1 bedside comode;Tub/shower bench;Wheelchair (measurements OT);Wheelchair cushion (measurements OT);Hospital bed    Recommendations for Other Services    Frequency Min 2X/week   Progress towards OT Goals Progress towards OT goals: Not progressing toward goals - comment (pt very lethargic)  Plan Discharge plan needs to be updated    Precautions / Restrictions Precautions Precautions: Fall Precaution Comments: Pt with right sided weakness, left gaze preference, has been moving in and out of A-fib with RVR, monitor HR Restrictions Weight Bearing Restrictions: No   Pertinent Vitals/Pain Pt groaned at times as if she felt pain but unable to assess where pain is due to communication and cognitive deficits.     ADL  Eating/Feeding: NPO Grooming: Performed;+1 Total assistance;Teeth care;Wash/dry face;Wash/dry hands;Brushing hair Where Assessed - Grooming: Supported sitting Toilet Transfer: Occupational hygienisterformed;+2  Total assistance Toilet Transfer: Patient Percentage: 0% StatisticianToilet Transfer Method: Ambulance personquat pivot Toilet Transfer Equipment: Materials engineerBedside commode Toileting - Clothing Manipulation and Hygiene: +2 Total assistance Toileting - ArchitectClothing Manipulation and Hygiene: Patient Percentage: 0% Where Assessed - Glass blower/designerToileting Clothing Manipulation and Hygiene: Sit on 3-in-1 or toilet Equipment Used: Gait belt Transfers/Ambulation Related to ADLs: +2.  Pt very lethargic today and unable to assist at all during transfer.  Pt does tend to use LUE to push to the Right. ADL Comments: Pt dependent with all adls.  Pt did take washcloth to her face when wet cloth put in her hand but required hand over hand to continue washing face.    OT Diagnosis:    OT Problem List:   OT Treatment Interventions:     OT Goals(current goals can now be found in the care plan section) Acute Rehab OT Goals Patient Stated Goal: uhable to state.  Daughter would like for her to go to CIR level therapies at d/c from acute hospital setting.  OT Goal Formulation: With family Time For Goal Achievement: 10/28/13 Potential to Achieve Goals: Fair ADL Goals Pt Will Perform Grooming: with min assist;sitting Pt Will Perform Upper Body Bathing: with min assist;sitting Pt Will Transfer to Toilet: with mod assist;stand pivot transfer;bedside commode Additional ADL Goal #1: Maintain midline postural control x 10 min sitting EOB with min A in preparation for ADL Additional ADL Goal #2: Identify 5/5 objects in R visual field with min vc in nondistracting environment to increasing visual attention and scanning for ADL  Visit Information  Last OT Received On: 10/20/13 Assistance Needed: +2 PT/OT/SLP Co-Evaluation/Treatment: Yes Reason for Co-Treatment: Complexity of the patient's impairments (multi-system involvement) OT goals addressed during session: ADL's and self-care  History of Present Illness: 73 yo with HTN presenting 2/28 with right hemiparesis and  facial droop PMHx macular degeneration (Lt eye worse than Rt per daughter).  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  Pt also having episodes of A-fib with RVR rates into the 150s during her stay.      Subjective Data      Prior Functioning       Cognition  Cognition Arousal/Alertness: Lethargic Behavior During Therapy: Flat affect Overall Cognitive Status: Impaired/Different from baseline Area of Impairment: Attention;Following commands;Safety/judgement;Awareness;Problem solving Orientation Level: Disoriented to;Situation Current Attention Level: Focused Memory: Decreased short-term memory Following Commands: Follows one step commands inconsistently;Follows one step commands with increased time Safety/Judgement: Decreased awareness of safety;Decreased awareness of deficits Awareness: Intellectual (if any) Problem Solving: Slow processing;Decreased initiation;Requires verbal cues;Requires tactile cues General Comments: Pt preforms more automatic tasks today.  Once movement initiated, she would initiate, when given wash cloth in left hand she brought it up to her face.   Difficult to assess due to: Impaired communication    Mobility  Bed Mobility Overal bed mobility: Needs Assistance Bed Mobility: Supine to Sit Supine to sit: +2 for physical assistance;Max assist General bed mobility comments: Once movement started, pt did help initiate pull to sitting by using her left arm to pull against therapist.   Transfers Overall transfer level: Needs assistance Equipment used: None Transfers: Squat Pivot Transfers Squat pivot transfers: +2 physical assistance;Total assist General transfer comment: unable to get pt into full standing due to no assist on pts part today.     Exercises      Balance Balance Overall balance assessment: Needs assistance Sitting-balance support: Bilateral upper extremity supported;Feet supported Sitting balance-Leahy Scale: Zero Sitting balance - Comments: Attemted to facilitate pt's abdominal muscles but giving input in low back and at chest but pt required total assist to maintain sitting on EOB. Postural control: Right lateral lean;Other (comment) (Pt pushes self with L hand to the right.) Standing balance support: During functional activity;Bilateral upper extremity supported Standing balance-Leahy Scale: Zero Standing balance comment: Pt never achieved full standing position  End of Session OT - End of Session Equipment Utilized During Treatment: Gait belt Activity Tolerance: Patient limited by lethargy Patient left: in chair;with call bell/phone within reach Nurse Communication: Mobility status  GO     Tamanika, Heiney 10/20/2013, 11:23 AM [phone]

## 2013-10-20 NOTE — Progress Notes (Signed)
Patient: Wendy Malone / Admit Date: 10/11/2013 / Date of Encounter: 10/20/2013, 10:15 AM Primary Cardiologist:  Subjective  Severely aphasic, does not respond to questions or commands, makes random verbal noises. Does not appear in pain or SOB.  Objective   Telemetry: NSR  Physical Exam: Blood pressure 190/60 ->158/70 systolic after hydralazine, pulse 78, temperature 98.8 F (37.1 C), temperature source Axillary, resp. rate 20, height 5' (1.524 m), weight 147 lb 11.3 oz (67 kg), SpO2 96.00%. General: Well developed WF in no acute distress. Head: Normocephalic, atraumatic, sclera non-icteric, no xanthomas, nares are without discharge. NGT in place Neck: JVP not elevated. Lungs: Coarse BS throughout without wheezes, rales, or rhonchi. Breathing is unlabored. Heart: RRR S1 S2 without murmurs, rubs, or gallops.  Abdomen: Soft, non-tender, non-distended with normoactive bowel sounds. No rebound/guarding. Extremities: No clubbing or cyanosis. No edema. Distal pedal pulses are 2+ and equal bilaterally. Neuro: Not A+Ox3. Nonverbal. Does not reliably follow commands.   Intake/Output Summary (Last 24 hours) at 10/20/13 1015 Last data filed at 10/20/13 0600  Gross per 24 hour  Intake   1700 ml  Output    900 ml  Net    800 ml    Inpatient Medications:  . antiseptic oral rinse  15 mL Mouth Rinse BID  . aspirin  81 mg Per NG tube Daily  . cloNIDine  0.2 mg Transdermal Weekly  . diltiazem  60 mg Oral QID  . famotidine  20 mg Oral Daily  . free water  200 mL Per Tube Q6H  . heparin  5,000 Units Subcutaneous 3 times per day  . insulin aspart  0-15 Units Subcutaneous 6 times per day  . metoprolol tartrate  25 mg Oral BID   Infusions:  . feeding supplement (OSMOLITE 1.2 CAL) 1,000 mL (10/20/13 0008)    Labs:  Recent Labs  10/18/13 0527 10/20/13 0810  NA 152* 152*  K 3.4* 3.7  CL 114* 115*  CO2 23 23  GLUCOSE 208* 190*  BUN 88* 75*  CREATININE 2.04* 1.55*  CALCIUM 9.4 8.2*   No  results found for this basename: AST, ALT, ALKPHOS, BILITOT, PROT, ALBUMIN,  in the last 72 hours  Recent Labs  10/18/13 0527  WBC 6.8  HGB 9.8*  HCT 30.5*  MCV 92.4  PLT 237   No results found for this basename: CKTOTAL, CKMB, TROPONINI,  in the last 72 hours No components found with this basename: POCBNP,  No results found for this basename: HGBA1C,  in the last 72 hours   Radiology/Studies:  Ct Angio Head W/cm &/or Wo Cm  10/11/2013   CLINICAL DATA:  Code stroke. Right-sided weakness and slurred speech.  EXAM: CT ANGIOGRAPHY HEAD  TECHNIQUE: Multidetector CT imaging of the head was performed using the standard protocol during bolus administration of intravenous contrast. Multiplanar CT image reconstructions and MIPs were obtained to evaluate the vascular anatomy.  CONTRAST:  50mL OMNIPAQUE IOHEXOL 350 MG/ML SOLN  COMPARISON:  None.  FINDINGS: Gray-white differentiation is preserved the source images and postcontrast images without evidence for a focal cortical infarct. There are multiple lacunar infarcts within the basal ganglia bilaterally which are age-indeterminate. Extensive periventricular white matter hypoattenuation is present bilaterally.  No hemorrhage or mass lesion is evident.  The cervical internal carotid arteries are within normal limits bilaterally. Calcified and noncalcified plaque is present within the cavernous carotid arteries bilaterally. There is a moderate stenosis of the right cavernous carotid artery just proximal to the genu. Mild to moderate  narrowing is present in the supra clinoid left internal carotid artery. Supra clinoid segments are normal bilaterally. The A1 and M1 segments are normal bilaterally. The anterior communicating artery is patent.  Segmental narrowing within the pericallosal arteries is worse on the right. There is segmental narrowing of distal MCA branch vessels bilaterally, right greater than left.  The left vertebral artery is dominant. The right  PICA origin is visualized and normal. The left AICA is dominant. The basilar artery is within normal limits. Both posterior cerebral arteries originate from the basilar tip. Segmental stenoses are present in the proximal PCA branch vessels bilaterally. The dural sinuses are patent.  Review of the MIP images confirms the above findings.  IMPRESSION: 1. No acute or subacute cortical infarct is evident. 2. Bilateral basal ganglia infarcts are age indeterminate. 3. 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 4. Segmental stenoses within the PCA branch vessels bilaterally and right greater than left MCA branch vessels. 5. Segmental stenoses within the pericallosal arteries, right greater than left.   Electronically Signed   By: Gennette Pac M.D.   On: 10/11/2013 21:31   Dg Chest 2 View  10/11/2013   CLINICAL DATA:  Stroke.  EXAM: CHEST  2 VIEW  COMPARISON:  None.  FINDINGS: Cardiac silhouette is mildly enlarged. Normal mediastinal and hilar contours.  There are prominent bronchovascular markings. Lungs are otherwise clear. No pleural effusion or pneumothorax.  The bony thorax is intact.  IMPRESSION: No acute cardiopulmonary disease.   Electronically Signed   By: Amie Portland M.D.   On: 10/11/2013 21:03   Ct Head Wo Contrast  10/12/2013   CLINICAL DATA:  Altered mental status.  Unable to speak.  EXAM: CT HEAD WITHOUT CONTRAST  TECHNIQUE: Contiguous axial images were obtained from the base of the skull through the vertex without intravenous contrast.  COMPARISON:  CTA of the head performed 10/11/2013  FINDINGS: There is no evidence of acute infarction, mass lesion, or intra- or extra-axial hemorrhage on CT.  Diffuse periventricular and subcortical white matter change likely reflects small vessel ischemic microangiopathy. Scattered chronic lacunar infarcts are seen within the basal ganglia bilaterally.  The posterior fossa, including the cerebellum, brainstem and fourth ventricle, is  within normal limits. The third and lateral ventricles are unremarkable in appearance. The cerebral hemispheres demonstrate grossly normal gray-white differentiation. No mass effect or midline shift is seen.  There is no evidence of fracture; visualized osseous structures are unremarkable in appearance. The visualized portions of the orbits are within normal limits. The paranasal sinuses and mastoid air cells are well-aerated. No significant soft tissue abnormalities are seen.  IMPRESSION: 1. No acute intracranial pathology seen on CT. 2. Diffuse small vessel ischemic microangiopathy again noted; scattered chronic lacunar infarcts within the basal ganglia bilaterally.   Electronically Signed   By: Roanna Raider M.D.   On: 10/12/2013 03:26   Mr Brain Wo Contrast  10/12/2013   CLINICAL DATA:  Slurred speech. Ischemic stroke. Right hemiparesis and facial droop.  EXAM: MRI HEAD WITHOUT CONTRAST  TECHNIQUE: Multiplanar, multiecho pulse sequences of the brain and surrounding structures were obtained without intravenous contrast.  COMPARISON:  CT head without contrast 10/12/2013.  FINDINGS: Diffusion-weighted images demonstrate an acute nonhemorrhagic infarct involving the posterior left lentiform nucleus extending into the body of caudate and coronal radiata. The area measures 3.2 x 2.5 x 1.4 cm. The area is present within the region of diffuse white matter disease. Advanced atrophy and extensive white matter changes are  present bilaterally. Remote lacunar infarcts are present in the brainstem and inferior cerebellum bilaterally. Remote lacunar infarcts are present in the basal ganglia bilaterally as well.  Flow is present in the major intracranial arteries. The globes and orbits are intact. Mild mucosal thickening is present in the sphenoid sinuses bilaterally. There is minimal mucosal thickening in the ethmoid air cells and inferior left frontal sinus. The mastoid air cells are clear.  IMPRESSION: 1. Acute  nonhemorrhagic infarct involving the posterior left lentiform nucleus extending superiorly into the corona radiata. 2. Additional remote lacunar infarcts are present in the basal ganglia, brainstem, inferior cerebellum bilaterally. 3. Atrophy and diffuse white matter changes are present bilaterally including the area of acute infarct. This likely reflects the sequelae of chronic microvascular ischemia. 4. Minimal sinus disease. These results were called by telephone at the time of interpretation on 10/12/2013 at 11:55 AM to Dr. Loretha Brasil, who verbally acknowledged these results.   Electronically Signed   By: Gennette Pac M.D.   On: 10/12/2013 11:58   Dg Chest Port 1 View  10/16/2013   CLINICAL DATA:  NG tube placement.  EXAM: PORTABLE CHEST - 1 VIEW  COMPARISON:  One-view chest 10/14/2013.  FINDINGS: The NG tube courses off the inferior border of the film. Heart size is within normal limits. Atherosclerotic calcifications are again noted at the aortic arch. Aeration of both lungs is markedly improved. Mild pulmonary vascular congestion persists. No significant focal airspace disease is present. The visualized soft tissues and bony thorax are unremarkable.  IMPRESSION: 1. The NG tube courses off the inferior border of the film. 2. Significantly improved airspace disease with some residual mild pulmonary vascular congestion.   Electronically Signed   By: Gennette Pac M.D.   On: 10/16/2013 20:19   Dg Chest Port 1 View  10/14/2013   CLINICAL DATA:  Respiratory distress.  EXAM: PORTABLE CHEST - 1 VIEW  COMPARISON:  There prior  FINDINGS: Cardiac silhouette remains mildly enlarged, mediastinal silhouette is nonsuspicious, calcified aortic knob. Diffuse interstitial prominence increased from prior examination with patchy bibasilar airspace opacities. Small right pleural effusion. No pneumothorax.  Patient is osteopenic. Left antecubital intravenous catheter in place. Multiple EKG lines overlie the patient and may  obscure subtle underlying pathology.  IMPRESSION: Stable cardiomegaly with increasing interstitial prominence suggesting pulmonary edema, mild patchy airspace opacity lung bases may reflect confluent edema, atelectasis, less likely pneumonia with small right pleural effusion.   Electronically Signed   By: Awilda Metro   On: 10/14/2013 05:03   Dg Abd Portable 1v  10/17/2013   CLINICAL DATA:  Nasogastric tube placement.  EXAM: PORTABLE ABDOMEN - 1 VIEW  COMPARISON:  DG ABD PORTABLE 1V dated 10/16/2013  FINDINGS: The bowel gas pattern is normal. No radio-opaque calculi or other significant radiographic abnormality are seen. Nasogastric tube tip lies along the greater curvature the stomach. The last side hole is located near the GE junction. Tube advancement of at least 3 cm would be more optimal.  IMPRESSION: Nasogastric tube placement as described.   Electronically Signed   By: Davonna Belling M.D.   On: 10/17/2013 22:41   Dg Abd Portable 1v  10/17/2013   CLINICAL DATA:  NG tube placement  EXAM: PORTABLE ABDOMEN - 1 VIEW  COMPARISON:  None.  FINDINGS: Nasogastric tube terminates in the proximal stomach. The side port is at the GE junction. The upper abdominal bowel gas pattern is unremarkable. Mild atelectasis in the retrocardiac lung.  IMPRESSION: Nasogastric tube which ends in  the proximal stomach. The side port is at the GE junction, advancement by 3 cm may improve catheter function.   Electronically Signed   By: Tiburcio Pea M.D.   On: 10/17/2013 06:07     Assessment and Plan  1. CVA/acute ischemic left basal ganglia stroke - complicated by severe aphasia and dysphagia. Goals of care meeting scheduled for this afternoon; will need SNF vs palliative care. 2. Dysphagia with tube feeds in place - GI consulted for PEG tube placement per notes. 3. Uncontrolled HTN - consider increasing metoprolol to q6hr if OK with IM as some degree of permissive HTN with stroke.  4. Acute respiratory failure -  improved. 5. Paroxysmal atrial fibrillation with RVR - maintaining NSR. Can consider amio in the future if this recurs. Previous notes indicate that anticoag was being considered, but was on hold due to risk of hemorrhagic transformation and inability to swallow. Now has NGT. Pending outcome of goals of care meeting, will need to revisit this issue. 6. Anemia - Dr Isidoro Donning had discussed in detail with the patient's daughter on 3/2, her mother did not want any invasive procedures/colonoscopy when her primary care physician had suggested in December 2014. 7. Acute renal insufficiency - follow. 8. Acute diastolic CHF - weight continuing to rise but oxygen level OK. Options are limited in the setting of significant hypernatremia. Will discuss use of Lasix in this setting with MD. 9. Hyponatremia - gradually improving with free water. 10. NSTEMI peak troponin 4.15 - No chest pain. Etiology uncertain. Possibly related to CVA. No present plans for workup. Continue aspirin. Can reassess as outpatient if appropriate.  Signed, Ronie Spies PA-C

## 2013-10-20 NOTE — Progress Notes (Signed)
CSW stopped by Patient's room. Patient's daughter was not at the bedside. CSW attempted to call the daughter and left message. RN informed CSW that a palliative GOC meeting was scheduled for this afternoon and a disposition would be determined.  CSW will return 10/21/13 after GOC meeting has been conducted.    Sabino NiemannAmy Sherryn Pollino, MSW, [employer]CSWA [phone]

## 2013-10-20 NOTE — Progress Notes (Signed)
Patient ID: Wendy Malone  female  ZOX:[id number]    DOB: 24-Aug-1940    DOA: 10/11/2013  PCP: No primary provider on file.  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   Dysphagia  - GI consult called for anemia workup and possible PEG tube placement. Prognosis for full recovery is poor and will need long-term placement.  - Patient has been seen by GI and not feel any workup for her anemia is needed at this time and recommended placement of a panda tube  - Patient was started on dysphagia diet, and did not do well with the diet, NG tube was placed however she pulled it out x2. It was replaced back third time, currently receiving free water and tube feeds - Palliative medicine consult was obtained after my previous discussion with the daughter, Dr. Ladona Ridgel to meet with daughter for goals of care. - The patient's daughter would like to be consulted if pt pulls out NG again- she is in restraints for now   Uncontrolled hypertension  - Placed on clonidine patch, on scheduled metoprolol, Cardizem on IV labetalol and hydralazine as needed   Acute respiratory failure  - Resolved, secondary to pulmonary edema  - 2-D echo was done 3/1 which had shown EF of 60-65% with grade 1 diastolic dysfunction   Atrial fibrillation  - Paroxysmal, currently in sinus rhythm, started on Po metoprolol and cardizem  - Currently on aspirin PR, per cardiology, will need to be anticoagulated when cleared by neurology  - also  workup for anemia prior to starting anticoagulation   Anemia  - B12 folate normal, 42, ferritin 92  - I had discussed in detail with the patient's daughter on 3/2, her mother did not want any invasive procedures/colonoscopy when her primary care physician had suggested In December 2014   Acute kidney failure  -Creatinine is now improving with IV fluids, but lasix given again on 3/6 for mild fluid overload- BUN Cr ratio rising again- for now cont free water through NG   Hypernatremia  - cont Free water 200 ml q 6 hrs   DVT Prophylaxis:  Code Status: DO NOT RESUSCITATE  Family Communication:  Disposition: Pending goals of care, patient will likely need skilled nursing facility  Consultants:  Neurology  Cardiology  Inpatient rehabilitation  Palliative medicine    Subjective: Patient alert and awake, voices no complaints, also has dysarthria  Objective: Weight change:   Intake/Output Summary (Last 24 hours) at 10/20/13 1418 Last data filed at 10/20/13 1000  Gross per 24 hour  Intake   1580 ml  Output    900 ml  Net    680 ml   Blood pressure 156/55, pulse 64, temperature 98 F (36.7 C), temperature source Axillary, resp. rate 18, height 5' (1.524 m), weight 67 kg (147 lb 11.3 oz), SpO2 96.00%.  Physical Exam: General: Alert and awake,  not in any acute distress, dysarthria. CVS: S1-S2 clear, no murmur rubs or gallops Chest: clear to auscultation bilaterally, no wheezing, rales or rhonchi Abdomen: soft nontender, nondistended, normal bowel sounds  Extremities: no cyanosis, clubbing  or edema noted bilaterally   Lab Results: Basic Metabolic Panel:  Recent Labs Lab 10/17/13 0450 10/18/13 0527 10/20/13 0810  NA 155* 152* 152*  K 3.5* 3.4* 3.7  CL 117* 114* 115*  CO2 24 23 23   GLUCOSE 212* 208* 190*  BUN 70* 88* 75*  CREATININE 1.91* 2.04* 1.55*  CALCIUM 9.1 9.4 8.2*  MG 3.0*  --   --    Liver Function Tests:  Recent Labs Lab 10/17/13 0450  AST 22    ALT 36*  ALKPHOS 48  BILITOT 0.4  PROT 5.6*  ALBUMIN 2.7*   No results found for this basename: LIPASE, AMYLASE,  in the last 168 hours No results found for this basename: AMMONIA,  in the last 168 hours CBC:  Recent Labs Lab 10/17/13 0450 10/18/13 0527  WBC 5.0 6.8  HGB 8.0* 9.8*  HCT 25.4* 30.5*  MCV 93.4 92.4  PLT 200 237   Cardiac Enzymes: No results found for this basename: CKTOTAL, CKMB, CKMBINDEX, TROPONINI,  in the last 168 hours BNP: No components found with this basename: POCBNP,  CBG:  Recent Labs Lab 10/19/13 2024 10/19/13 2358 10/20/13 0404 10/20/13 0710 10/20/13 1203  GLUCAP 142* 133* 144* 146* 182*     Micro Results: Recent Results (from the past 240 hour(s))  MRSA PCR SCREENING     Status: None   Collection Time    10/11/13  9:40 PM      Result Value Ref Range Status   MRSA by PCR NEGATIVE  NEGATIVE Final   Comment:            The GeneXpert MRSA Assay (FDA     approved for NASAL specimens     only), is one component of a     comprehensive MRSA colonization     surveillance program. It is not     intended to diagnose MRSA     infection nor to guide or     monitor treatment for     MRSA infections.    Studies/Results: Ct Angio Head W/cm &/or Wo Cm  10/11/2013   CLINICAL DATA:  Code stroke. Right-sided weakness and slurred speech.  EXAM: CT ANGIOGRAPHY HEAD  TECHNIQUE: Multidetector CT imaging of the head was performed using the standard protocol during bolus administration of intravenous contrast. Multiplanar CT image reconstructions and MIPs were obtained to evaluate the vascular anatomy.  CONTRAST:  50mL OMNIPAQUE IOHEXOL 350 MG/ML SOLN  COMPARISON:  None.  FINDINGS: Gray-white differentiation is preserved the source images and postcontrast images without evidence for a focal cortical infarct. There are multiple lacunar infarcts within the basal ganglia bilaterally which are age-indeterminate. Extensive periventricular white matter  hypoattenuation is present bilaterally.  No hemorrhage or mass lesion is evident.  The cervical internal carotid arteries are within normal limits bilaterally. Calcified and noncalcified plaque is present within the cavernous carotid arteries bilaterally. There is a moderate stenosis of the right cavernous carotid artery just proximal to the genu. Mild to moderate narrowing is present in the supra clinoid left internal carotid artery. Supra clinoid segments are normal bilaterally. The A1 and M1 segments are normal bilaterally. The anterior communicating artery is patent.  Segmental narrowing within the pericallosal arteries is worse on the right. There is segmental narrowing of distal MCA branch vessels bilaterally, right greater than left.  The left vertebral artery is dominant. The right PICA origin is visualized and normal. The left AICA is dominant. The basilar artery is within normal limits. Both posterior cerebral  arteries originate from the basilar tip. Segmental stenoses are present in the proximal PCA branch vessels bilaterally. The dural sinuses are patent.  Review of the MIP images confirms the above findings.  IMPRESSION: 1. No acute or subacute cortical infarct is evident. 2. Bilateral basal ganglia infarcts are age indeterminate. 3. 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 4. Segmental stenoses within the PCA branch vessels bilaterally and right greater than left MCA branch vessels. 5. Segmental stenoses within the pericallosal arteries, right greater than left.   Electronically Signed   By: Gennette Pac M.D.   On: 10/11/2013 21:31   Dg Chest 2 View  10/11/2013   CLINICAL DATA:  Stroke.  EXAM: CHEST  2 VIEW  COMPARISON:  None.  FINDINGS: Cardiac silhouette is mildly enlarged. Normal mediastinal and hilar contours.  There are prominent bronchovascular markings. Lungs are otherwise clear. No pleural effusion or pneumothorax.  The bony thorax is intact.  IMPRESSION:  No acute cardiopulmonary disease.   Electronically Signed   By: Amie Portland M.D.   On: 10/11/2013 21:03   Ct Head Wo Contrast  10/12/2013   CLINICAL DATA:  Altered mental status.  Unable to speak.  EXAM: CT HEAD WITHOUT CONTRAST  TECHNIQUE: Contiguous axial images were obtained from the base of the skull through the vertex without intravenous contrast.  COMPARISON:  CTA of the head performed 10/11/2013  FINDINGS: There is no evidence of acute infarction, mass lesion, or intra- or extra-axial hemorrhage on CT.  Diffuse periventricular and subcortical white matter change likely reflects small vessel ischemic microangiopathy. Scattered chronic lacunar infarcts are seen within the basal ganglia bilaterally.  The posterior fossa, including the cerebellum, brainstem and fourth ventricle, is within normal limits. The third and lateral ventricles are unremarkable in appearance. The cerebral hemispheres demonstrate grossly normal gray-white differentiation. No mass effect or midline shift is seen.  There is no evidence of fracture; visualized osseous structures are unremarkable in appearance. The visualized portions of the orbits are within normal limits. The paranasal sinuses and mastoid air cells are well-aerated. No significant soft tissue abnormalities are seen.  IMPRESSION: 1. No acute intracranial pathology seen on CT. 2. Diffuse small vessel ischemic microangiopathy again noted; scattered chronic lacunar infarcts within the basal ganglia bilaterally.   Electronically Signed   By: Roanna Raider M.D.   On: 10/12/2013 03:26   Mr Brain Wo Contrast  10/12/2013   CLINICAL DATA:  Slurred speech. Ischemic stroke. Right hemiparesis and facial droop.  EXAM: MRI HEAD WITHOUT CONTRAST  TECHNIQUE: Multiplanar, multiecho pulse sequences of the brain and surrounding structures were obtained without intravenous contrast.  COMPARISON:  CT head without contrast 10/12/2013.  FINDINGS: Diffusion-weighted images demonstrate an  acute nonhemorrhagic infarct involving the posterior left lentiform nucleus extending into the body of caudate and coronal radiata. The area measures 3.2 x 2.5 x 1.4 cm. The area is present within the region of diffuse white matter disease. Advanced atrophy and extensive white matter changes are present bilaterally. Remote lacunar infarcts are present in the brainstem and inferior cerebellum bilaterally. Remote lacunar infarcts are present in the basal ganglia bilaterally as well.  Flow is present in the major intracranial arteries. The globes and orbits are intact. Mild mucosal thickening is present in the sphenoid sinuses bilaterally. There is minimal mucosal thickening in the ethmoid air cells and inferior left frontal sinus. The mastoid air cells are clear.  IMPRESSION: 1. Acute nonhemorrhagic infarct involving the posterior left lentiform nucleus extending superiorly into  the corona radiata. 2. Additional remote lacunar infarcts are present in the basal ganglia, brainstem, inferior cerebellum bilaterally. 3. Atrophy and diffuse white matter changes are present bilaterally including the area of acute infarct. This likely reflects the sequelae of chronic microvascular ischemia. 4. Minimal sinus disease. These results were called by telephone at the time of interpretation on 10/12/2013 at 11:55 AM to Dr. Loretha Brasil, who verbally acknowledged these results.   Electronically Signed   By: Gennette Pac M.D.   On: 10/12/2013 11:58   Dg Chest Port 1 View  10/16/2013   CLINICAL DATA:  NG tube placement.  EXAM: PORTABLE CHEST - 1 VIEW  COMPARISON:  One-view chest 10/14/2013.  FINDINGS: The NG tube courses off the inferior border of the film. Heart size is within normal limits. Atherosclerotic calcifications are again noted at the aortic arch. Aeration of both lungs is markedly improved. Mild pulmonary vascular congestion persists. No significant focal airspace disease is present. The visualized soft tissues and bony  thorax are unremarkable.  IMPRESSION: 1. The NG tube courses off the inferior border of the film. 2. Significantly improved airspace disease with some residual mild pulmonary vascular congestion.   Electronically Signed   By: Gennette Pac M.D.   On: 10/16/2013 20:19   Dg Chest Port 1 View  10/14/2013   CLINICAL DATA:  Respiratory distress.  EXAM: PORTABLE CHEST - 1 VIEW  COMPARISON:  There prior  FINDINGS: Cardiac silhouette remains mildly enlarged, mediastinal silhouette is nonsuspicious, calcified aortic knob. Diffuse interstitial prominence increased from prior examination with patchy bibasilar airspace opacities. Small right pleural effusion. No pneumothorax.  Patient is osteopenic. Left antecubital intravenous catheter in place. Multiple EKG lines overlie the patient and may obscure subtle underlying pathology.  IMPRESSION: Stable cardiomegaly with increasing interstitial prominence suggesting pulmonary edema, mild patchy airspace opacity lung bases may reflect confluent edema, atelectasis, less likely pneumonia with small right pleural effusion.   Electronically Signed   By: Awilda Metro   On: 10/14/2013 05:03   Dg Abd Portable 1v  10/17/2013   CLINICAL DATA:  Nasogastric tube placement.  EXAM: PORTABLE ABDOMEN - 1 VIEW  COMPARISON:  DG ABD PORTABLE 1V dated 10/16/2013  FINDINGS: The bowel gas pattern is normal. No radio-opaque calculi or other significant radiographic abnormality are seen. Nasogastric tube tip lies along the greater curvature the stomach. The last side hole is located near the GE junction. Tube advancement of at least 3 cm would be more optimal.  IMPRESSION: Nasogastric tube placement as described.   Electronically Signed   By: Davonna Belling M.D.   On: 10/17/2013 22:41   Dg Abd Portable 1v  10/17/2013   CLINICAL DATA:  NG tube placement  EXAM: PORTABLE ABDOMEN - 1 VIEW  COMPARISON:  None.  FINDINGS: Nasogastric tube terminates in the proximal stomach. The side port is at the GE  junction. The upper abdominal bowel gas pattern is unremarkable. Mild atelectasis in the retrocardiac lung.  IMPRESSION: Nasogastric tube which ends in the proximal stomach. The side port is at the GE junction, advancement by 3 cm may improve catheter function.   Electronically Signed   By: Tiburcio Pea M.D.   On: 10/17/2013 06:07    Medications: Scheduled Meds: . antiseptic oral rinse  15 mL Mouth Rinse BID  . aspirin  81 mg Per NG tube Daily  . cloNIDine  0.2 mg Transdermal Weekly  . diltiazem  60 mg Oral QID  . famotidine  20 mg Oral Daily  .  free water  200 mL Per Tube Q6H  . heparin  5,000 Units Subcutaneous 3 times per day  . insulin aspart  0-15 Units Subcutaneous 6 times per day  . metoprolol tartrate  25 mg Oral BID      LOS: 9 days   Charlane Westry M.D. [facility] 10/20/2013, 2:18 PM Pager: [phone]  If 7PM-7AM, please contact night-coverage [email or web address] Password [email or web address]

## 2013-10-20 NOTE — Progress Notes (Signed)
Speech Language Pathology Treatment: Dysphagia  Patient Details Name: Wendy Malone MRN: [id number] DOB: [birth year] Today's Date: 10/20/2013 Time: [id number] SLP Time Calculation (min): 25 min  Assessment / Plan / Recommendation Clinical Impression  Patient lethargic.  Awakens briefly and is somewhat agitated when bolus is presented to lips.  Accepts bolus from spoon, but does not attempt to manipulate the applesauce, just holding the bolus anteriorly in oral cavity.  SLP suctioned after unsuccessful attempts/cues to swallow.     HPI     Pertinent Vitals lethargic  SLP Plan    Poor progress toward goals.  Recommend proceeding with PEG placement and SNF.  Daughter is not present today for education to change in plan.   Recommendations Diet recommendations: NPO Medication Administration: Via alternative means              Oral Care Recommendations: Oral care Q4 per protocol Follow up Recommendations: Skilled Nursing facility;24 hour supervision/assistance    GO     Maryjo RochesterWillis, Brayden Betters T 10/20/2013, 1:55 PM

## 2013-10-20 NOTE — Progress Notes (Signed)
Pt. Seen and examined. Agree with the NP/PA-C note as written.  Prognosis for functional recovery is poor. Plan for additional palliative care meetings today. BP remains elevated, but improves with hydralazine.  Creatinine is rising - would not increase metoprolol (can decrease renal blood flow). She is hypernatremic and is getting free water with tube feeds - may need to diurese, given free water replacement. Weight is increasing, however, she is net negative fluids since admit.  Wendy NoseKenneth C. Jaleal Schliep, MD, [facility]ACC Attending Cardiologist [facility]

## 2013-10-21 LAB — GLUCOSE, CAPILLARY
GLUCOSE-CAPILLARY: 112 mg/dL — AB (ref 70–99)
Glucose-Capillary: 94 mg/dL (ref 70–99)

## 2013-10-21 NOTE — Progress Notes (Signed)
Speech Language Pathology Discharge Patient Details Name: Wendy Malone MRN: [id number] DOB: 13-Aug-1941 Today's Date: 10/21/2013 Time:  -     Patient discharged from SLP services secondary to change to comfort care.  Pt. Has been unable to progress to po's.  Please see latest therapy progress note for current level of functioning and progress toward goals.         Maryjo RochesterWillis, Akera Snowberger T 10/21/2013, 1:27 PM

## 2013-10-21 NOTE — Progress Notes (Signed)
Patient ID:Wendy Malone      DOB: 03-26-1941      GMW:[id number]   [facility]    Subjective:  Patient sitting up in chair watching TV .  No distress. Remains unable to communicate needs clearly.  She looks obviously relived to have the feeding tube out.  No distress.  Updated daughter.     Filed Vitals:   10/21/13 1332  BP: 178/72  Pulse: 93  Temp: 99.7 F (37.6 C)  Resp: 18   Physical exam:  Deferred.  Assessment and plan: 73 yr old white female with large stroke . Family has elected comfort care and transition to hospice facility.  1.  DNR  2. Stop CBG since not on feeding.  3. Continue Metoprolol for rate control. Can be dcd at discharge.   Total time : 15 min  Emmakate Hypes L. Ladona Ridgelaylor, MD MBA [facility] Pall[facility] Team Phone: [phone] Pager: [phone]

## 2013-10-21 NOTE — Progress Notes (Signed)
CSW contacted Patient's daughter and she requested that CSW make a referral to [facility]. CSW made the referral. At this time, the [facility] has a waiting list. CSW contacted patient's daughter to chose another [facility]. [facility] and [facility] are unable to take the patient today. CSW will continue to follow up with these facilities to see about availability.    Sabino NiemannAmy Willys Salvino, MSW, [employer]CSWA [phone]

## 2013-10-21 NOTE — Progress Notes (Signed)
Palliative care recommendations noted. Agree with IV b-blocker for rate control.  Awaiting hospice placement.  Cardiology will sign-off. No follow-up appointment necessary.  Please call with questions.  Chrystie NoseKenneth C. Zaryia Markel, MD, [facility] Attending Cardiologist [facility]

## 2013-10-21 NOTE — Progress Notes (Signed)
Patient ID: Wendy Malone  female  ZOX:[id number]    DOB: [birth year]    DOA: 10/11/2013  PCP: No primary provider on file.  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    Dysphagia  - Palliative Care consulted, who recommended discontinuing feeding tube.    Uncontrolled hypertension  - Placed on clonidine patch, on scheduled metoprolol, Cardizem on IV labetalol and hydralazine as needed   Acute respiratory failure  - Resolved, secondary to pulmonary edema  - 2-D echo was done 3/1 which had shown EF of 60-65% with grade 1 diastolic dysfunction   Atrial fibrillation  - Paroxysmal, currently in sinus rhythm, started on Po metoprolol and cardizem   Anemia  - Stable   Acute kidney failure  -Creatinine is now improving with IV fluids, but lasix given again on 3/6 for mild fluid overload- BUN Cr ratio rising again- for now cont free water through NG    DVT Prophylaxis:  Code Status: DO NOT RESUSCITATE  Family Communication:  Disposition: Plan to transition to Inpatient hospice. SW consulted.   Consultants:  Neurology  Cardiology  Inpatient rehabilitation  Palliative medicine    Subjective: Patient is a 73 year old female with a past medical history of hypertension, admitted on 10/11/2013 presenting as a transfer from [facility] where she had presented with right hemiparesis and facial droop. She was not administered TPA on admission as if she was outside therapeutic window. MRI of the brain revealing acute nonhemorrhagic infarct involving the posterior left lentiform nucleus extending superiorly into the corona radiata. Neurology was consulted. Labs also revealed elevated troponins have  a peak troponin of 4.85 as cardiology was consulted as well. Transthoracic echocardiogram performed on 10/12/2013 showed an ejection fraction of 60-65% without wall motion abnormalities. During this hospitalization she had episodes of atrial fibrillation with rapid ventricular response as a was felt that CVA may have represented thromboembolic phenomenon. Patient having an overall poor functional status, palliative care consulted. Family members agreed to transition to hospice.   Objective: Weight change:   Intake/Output Summary (Last 24 hours) at 10/21/13 1707 Last data filed at 10/21/13 1300  Gross per 24 hour  Intake    660 ml  Output   1100 ml  Net   -440 ml   Blood pressure 178/72, pulse 93, temperature 99.7 F (37.6 C), temperature source Oral, resp. rate 18, height 5' (1.524 m), weight 65.273 kg (143 lb 14.4 oz), SpO2 96.00%.  Physical Exam: General: Alert and awake,  not in any acute distress, dysarthria. CVS: S1-S2 clear, no murmur rubs or gallops Chest: clear to auscultation bilaterally, no wheezing, rales or rhonchi Abdomen: soft nontender, nondistended, normal bowel sounds  Extremities: no cyanosis, clubbing or edema noted bilaterally   Lab Results: Basic Metabolic Panel:  Recent Labs Lab 10/17/13 0450 10/18/13 0527 10/20/13 0810  NA 155* 152* 152*  K 3.5* 3.4* 3.7  CL 117* 114* 115*  CO2 24 23 23   GLUCOSE 212* 208* 190*  BUN 70* 88* 75*  CREATININE 1.91* 2.04* 1.55*  CALCIUM 9.1 9.4 8.2*  MG 3.0*  --   --    Liver Function Tests:  Recent Labs Lab 10/17/13 0450  AST 22  ALT 36*  ALKPHOS 48  BILITOT 0.4  PROT 5.6*  ALBUMIN 2.7*  No results found for this basename: LIPASE, AMYLASE,  in the last 168 hours No results found for this basename: AMMONIA,  in the last 168 hours CBC:  Recent Labs Lab 10/17/13 0450 10/18/13 0527  WBC 5.0 6.8  HGB 8.0* 9.8*  HCT 25.4* 30.5*  MCV 93.4 92.4  PLT 200 237   Cardiac Enzymes: No results found for this  basename: CKTOTAL, CKMB, CKMBINDEX, TROPONINI,  in the last 168 hours BNP: No components found with this basename: POCBNP,  CBG:  Recent Labs Lab 10/20/13 1203 10/20/13 1654 10/20/13 2023 10/21/13 1133 10/21/13 1621  GLUCAP 182* 145* 118* 112* 94     Micro Results: Recent Results (from the past 240 hour(s))  MRSA PCR SCREENING     Status: None   Collection Time    10/11/13  9:40 PM      Result Value Ref Range Status   MRSA by PCR NEGATIVE  NEGATIVE Final   Comment:            The GeneXpert MRSA Assay (FDA     approved for NASAL specimens     only), is one component of a     comprehensive MRSA colonization     surveillance program. It is not     intended to diagnose MRSA     infection nor to guide or     monitor treatment for     MRSA infections.    Studies/Results: Ct Angio Head W/cm &/or Wo Cm  10/11/2013   CLINICAL DATA:  Code stroke. Right-sided weakness and slurred speech.  EXAM: CT ANGIOGRAPHY HEAD  TECHNIQUE: Multidetector CT imaging of the head was performed using the standard protocol during bolus administration of intravenous contrast. Multiplanar CT image reconstructions and MIPs were obtained to evaluate the vascular anatomy.  CONTRAST:  50mL OMNIPAQUE IOHEXOL 350 MG/ML SOLN  COMPARISON:  None.  FINDINGS: Gray-white differentiation is preserved the source images and postcontrast images without evidence for a focal cortical infarct. There are multiple lacunar infarcts within the basal ganglia bilaterally which are age-indeterminate. Extensive periventricular white matter hypoattenuation is present bilaterally.  No hemorrhage or mass lesion is evident.  The cervical internal carotid arteries are within normal limits bilaterally. Calcified and noncalcified plaque is present within the cavernous carotid arteries bilaterally. There is a moderate stenosis of the right cavernous carotid artery just proximal to the genu. Mild to moderate narrowing is present in the supra  clinoid left internal carotid artery. Supra clinoid segments are normal bilaterally. The A1 and M1 segments are normal bilaterally. The anterior communicating artery is patent.  Segmental narrowing within the pericallosal arteries is worse on the right. There is segmental narrowing of distal MCA branch vessels bilaterally, right greater than left.  The left vertebral artery is dominant. The right PICA origin is visualized and normal. The left AICA is dominant. The basilar artery is within normal limits. Both posterior cerebral arteries originate from the basilar tip. Segmental stenoses are present in the proximal PCA branch vessels bilaterally. The dural sinuses are patent.  Review of the MIP images confirms the above findings.  IMPRESSION: 1. No acute or subacute cortical infarct is evident. 2. Bilateral basal ganglia infarcts are age indeterminate. 3. 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 4. Segmental stenoses within the PCA branch vessels bilaterally and right greater than left MCA branch vessels. 5. Segmental stenoses within the pericallosal arteries, right greater than left.   Electronically Signed   By: Toribio Harbour.D.  On: 10/11/2013 21:31   Dg Chest 2 View  10/11/2013   CLINICAL DATA:  Stroke.  EXAM: CHEST  2 VIEW  COMPARISON:  None.  FINDINGS: Cardiac silhouette is mildly enlarged. Normal mediastinal and hilar contours.  There are prominent bronchovascular markings. Lungs are otherwise clear. No pleural effusion or pneumothorax.  The bony thorax is intact.  IMPRESSION: No acute cardiopulmonary disease.   Electronically Signed   By: Amie Portlandavid  Ormond M.D.   On: 10/11/2013 21:03   Ct Head Wo Contrast  10/12/2013   CLINICAL DATA:  Altered mental status.  Unable to speak.  EXAM: CT HEAD WITHOUT CONTRAST  TECHNIQUE: Contiguous axial images were obtained from the base of the skull through the vertex without intravenous contrast.  COMPARISON:  CTA of the head performed  10/11/2013  FINDINGS: There is no evidence of acute infarction, mass lesion, or intra- or extra-axial hemorrhage on CT.  Diffuse periventricular and subcortical white matter change likely reflects small vessel ischemic microangiopathy. Scattered chronic lacunar infarcts are seen within the basal ganglia bilaterally.  The posterior fossa, including the cerebellum, brainstem and fourth ventricle, is within normal limits. The third and lateral ventricles are unremarkable in appearance. The cerebral hemispheres demonstrate grossly normal gray-white differentiation. No mass effect or midline shift is seen.  There is no evidence of fracture; visualized osseous structures are unremarkable in appearance. The visualized portions of the orbits are within normal limits. The paranasal sinuses and mastoid air cells are well-aerated. No significant soft tissue abnormalities are seen.  IMPRESSION: 1. No acute intracranial pathology seen on CT. 2. Diffuse small vessel ischemic microangiopathy again noted; scattered chronic lacunar infarcts within the basal ganglia bilaterally.   Electronically Signed   By: Roanna RaiderJeffery  Chang M.D.   On: 10/12/2013 03:26   Mr Brain Wo Contrast  10/12/2013   CLINICAL DATA:  Slurred speech. Ischemic stroke. Right hemiparesis and facial droop.  EXAM: MRI HEAD WITHOUT CONTRAST  TECHNIQUE: Multiplanar, multiecho pulse sequences of the brain and surrounding structures were obtained without intravenous contrast.  COMPARISON:  CT head without contrast 10/12/2013.  FINDINGS: Diffusion-weighted images demonstrate an acute nonhemorrhagic infarct involving the posterior left lentiform nucleus extending into the body of caudate and coronal radiata. The area measures 3.2 x 2.5 x 1.4 cm. The area is present within the region of diffuse white matter disease. Advanced atrophy and extensive white matter changes are present bilaterally. Remote lacunar infarcts are present in the brainstem and inferior cerebellum  bilaterally. Remote lacunar infarcts are present in the basal ganglia bilaterally as well.  Flow is present in the major intracranial arteries. The globes and orbits are intact. Mild mucosal thickening is present in the sphenoid sinuses bilaterally. There is minimal mucosal thickening in the ethmoid air cells and inferior left frontal sinus. The mastoid air cells are clear.  IMPRESSION: 1. Acute nonhemorrhagic infarct involving the posterior left lentiform nucleus extending superiorly into the corona radiata. 2. Additional remote lacunar infarcts are present in the basal ganglia, brainstem, inferior cerebellum bilaterally. 3. Atrophy and diffuse white matter changes are present bilaterally including the area of acute infarct. This likely reflects the sequelae of chronic microvascular ischemia. 4. Minimal sinus disease. These results were called by telephone at the time of interpretation on 10/12/2013 at 11:55 AM to Dr. Loretha BrasilZEYLIKMAN, who verbally acknowledged these results.   Electronically Signed   By: Gennette Pachris  Mattern M.D.   On: 10/12/2013 11:58   Dg Chest Port 1 View  10/16/2013   CLINICAL DATA:  NG tube  placement.  EXAM: PORTABLE CHEST - 1 VIEW  COMPARISON:  One-view chest 10/14/2013.  FINDINGS: The NG tube courses off the inferior border of the film. Heart size is within normal limits. Atherosclerotic calcifications are again noted at the aortic arch. Aeration of both lungs is markedly improved. Mild pulmonary vascular congestion persists. No significant focal airspace disease is present. The visualized soft tissues and bony thorax are unremarkable.  IMPRESSION: 1. The NG tube courses off the inferior border of the film. 2. Significantly improved airspace disease with some residual mild pulmonary vascular congestion.   Electronically Signed   By: Gennette Pac M.D.   On: 10/16/2013 20:19   Dg Chest Port 1 View  10/14/2013   CLINICAL DATA:  Respiratory distress.  EXAM: PORTABLE CHEST - 1 VIEW  COMPARISON:  There  prior  FINDINGS: Cardiac silhouette remains mildly enlarged, mediastinal silhouette is nonsuspicious, calcified aortic knob. Diffuse interstitial prominence increased from prior examination with patchy bibasilar airspace opacities. Small right pleural effusion. No pneumothorax.  Patient is osteopenic. Left antecubital intravenous catheter in place. Multiple EKG lines overlie the patient and may obscure subtle underlying pathology.  IMPRESSION: Stable cardiomegaly with increasing interstitial prominence suggesting pulmonary edema, mild patchy airspace opacity lung bases may reflect confluent edema, atelectasis, less likely pneumonia with small right pleural effusion.   Electronically Signed   By: Awilda Metro   On: 10/14/2013 05:03   Dg Abd Portable 1v  10/17/2013   CLINICAL DATA:  Nasogastric tube placement.  EXAM: PORTABLE ABDOMEN - 1 VIEW  COMPARISON:  DG ABD PORTABLE 1V dated 10/16/2013  FINDINGS: The bowel gas pattern is normal. No radio-opaque calculi or other significant radiographic abnormality are seen. Nasogastric tube tip lies along the greater curvature the stomach. The last side hole is located near the GE junction. Tube advancement of at least 3 cm would be more optimal.  IMPRESSION: Nasogastric tube placement as described.   Electronically Signed   By: Davonna Belling M.D.   On: 10/17/2013 22:41   Dg Abd Portable 1v  10/17/2013   CLINICAL DATA:  NG tube placement  EXAM: PORTABLE ABDOMEN - 1 VIEW  COMPARISON:  None.  FINDINGS: Nasogastric tube terminates in the proximal stomach. The side port is at the GE junction. The upper abdominal bowel gas pattern is unremarkable. Mild atelectasis in the retrocardiac lung.  IMPRESSION: Nasogastric tube which ends in the proximal stomach. The side port is at the GE junction, advancement by 3 cm may improve catheter function.   Electronically Signed   By: Tiburcio Pea M.D.   On: 10/17/2013 06:07    Medications: Scheduled Meds: . antiseptic oral rinse  15  mL Mouth Rinse BID  . cloNIDine  0.2 mg Transdermal Weekly  . metoprolol  5 mg Intravenous Q6H      LOS: 10 days   Jeralyn Bennett M.D. [facility] 10/21/2013, 5:07 PM Pager: [phone]  If 7PM-7AM, please contact night-coverage [email or web address] Password [email or web address]

## 2013-10-21 NOTE — Progress Notes (Signed)
Physical Therapy Treatment Patient Details Name: Wendy Malone MRN: [id number] DOB: [birth year] Today's Date: 10/21/2013 Time: [id number] PT Time Calculation (min): 17 min  PT Assessment / Plan / Recommendation  History of Present Illness 73 yo with HTN presenting 2/28 with right hemiparesis and facial droop PMHx macular degeneration (Lt eye worse than Rt per daughter).  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  Pt also having episodes of A-fib with RVR rates into the 150s during her stay.     PT Comments   Pt is now being followed by hospice/palliative care services.  I spoke on the phone with the patient's daughter about plan for PT to continue with therapy services.  Daughter would like for us to continue at this time (at least during her hospital stay).  PT will continue to follow acutely and pt seemed to do better today assisting with sitting, and following more commands.    Follow Up Recommendations  SNF     Does the patient have the potential to tolerate intense rehabilitation    NA  Barriers to Discharge   None      Equipment Recommendations  Wheelchair (measurements PT);Wheelchair cushion (measurements PT);Hospital bed;Other (comment) (hoyer lift)    Recommendations for Other Services   None  Frequency Min 3X/week   Progress towards PT Goals Progress towards PT goals: Progressing toward goals  Plan Current plan remains appropriate    Precautions / Restrictions Precautions Precautions: Fall (limited vision/macular degeneration) Precaution Comments: Pt with right sided weakness, left gaze preference, has been moving in and out of A-fib with RVR, monitor HR   Pertinent Vitals/Pain See vitals flow sheet.    Mobility  Bed Mobility Overal bed mobility: Needs  Assistance Bed Mobility: Supine to Sit Supine to sit: Mod assist;HOB elevated General bed mobility comments: Assist to move bil legs over the side of the bed.  When given cue to pull up with her left arm, pt pulled up to sitting with mod resistance from therapist to get to sitting.   Transfers Overall transfer level: Needs assistance Equipment used: None Transfers: Squat Pivot Transfers Squat pivot transfers: Max assist General transfer comment: Max assist to support trunk and anteriorly weight shift body to squat to the left into the recliner chair.  The pt when her left arm is free is pushing on arm rest of the chair to the right, so encouraged pt to hold to PT instead of chair.  She was taking some support on her left leg , but most of her assist came from her ability to hold on with her left hand.   Modified Rankin (Stroke Patients Only) Pre-Morbid Rankin Score: No symptoms Modified Rankin: Severe disability      PT Goals (current goals can now be found in the care plan section) Acute Rehab PT Goals Patient Stated Goal: uhable to state.  Daughter would like for us to continue therapy despite hospice/palliative care involvement.    Visit Information  Last PT Received On: 10/21/13 Assistance Needed: +2 History of Present Illness: 73 yo with HTN presenting 2/28 with right hemiparesis and facial droop PMHx macular degeneration (Lt eye worse than Rt per daughter).  MRI of the brain showed acute nonhemorrhagic infarct involving the posterior left lentiform nucleus extending superiorly into the corona radiata as well as additional remote lacunar infarcts present basal ganglia, brainstem,  inferior cerebellum bilaterally.  Pt also having episodes of A-fib with RVR rates into the 150s during her stay.      Subjective Data  Subjective: Pt seems much more alert today.  Following more commands than last session.   Patient Stated Goal: uhable to state.  Daughter would like for [place] to continue therapy  despite hospice/palliative care involvement.     Cognition  Cognition Arousal/Alertness: Awake/alert Behavior During Therapy: Flat affect Overall Cognitive Status: Impaired/Different from baseline Area of Impairment: Attention;Following commands;Safety/judgement;Awareness;Problem solving Current Attention Level: Focused Following Commands: Follows one step commands inconsistently Safety/Judgement: Decreased awareness of safety;Decreased awareness of deficits Awareness: Intellectual Problem Solving: Slow processing;Decreased initiation;Difficulty sequencing;Requires verbal cues;Requires tactile cues General Comments: Pt was able to follow ~50% of one step commands on her left side today.  The other 50% required tactile and verbal cues to initiate movement.  Difficult to assess due to: Impaired communication    Balance  Balance Overall balance assessment: Needs assistance Sitting-balance support: Feet supported;Single extremity supported Sitting balance-Leahy Scale: Poor Sitting balance - Comments: Pt with much better sitting balance today, less pushing to the right.  She was able to progress to min assist level for breif period of time in sitting.  At times, though she would move or her weight would come forward and max assist required to prevent anterior LOB off of the EOB.   Standing balance support: Single extremity supported Standing balance-Leahy Scale: Zero  End of Session PT - End of Session Equipment Utilized During Treatment: Gait belt Activity Tolerance: Patient limited by fatigue;Patient limited by lethargy Patient left: in chair;with call bell/phone within reach Nurse Communication: Mobility status;Need for lift equipment     Bowen Kia B. Macsen Nuttall, PT, DPT [phone]   10/21/2013, 4:57 PM

## 2013-10-21 NOTE — Progress Notes (Signed)
Nutrition Brief Note  Chart reviewed. Pt now transitioning to comfort care. Per Palliative Note on 3/9 - patient's daughter has decided to discontinue tube feedings. No further nutrition interventions warranted at this time.  Please re-consult as needed.   Jarold MottoSamantha Etha Stambaugh MS, RD, LDN Inpatient Registered Dietitian Pager: [phone] After-hours pager: [phone]

## 2013-10-22 MED ORDER — MORPHINE SULFATE (CONCENTRATE) 10 MG /0.5 ML PO SOLN: 5.0000 mg | ORAL | Status: AC | PRN

## 2013-10-22 MED ORDER — CLONIDINE HCL 0.2 MG/24HR TD PTWK
0.2000 mg | MEDICATED_PATCH | TRANSDERMAL | Status: AC
Start: 2013-10-22 — End: ?

## 2013-10-22 MED ORDER — LEVALBUTEROL HCL 0.63 MG/3ML IN NEBU
0.6300 mg | INHALATION_SOLUTION | RESPIRATORY_TRACT | Status: AC | PRN
Start: 2013-10-22 — End: ?

## 2013-10-22 NOTE — Progress Notes (Signed)
This nurse called [facility] in order to give report. Placed on hold at at [phone] awaiting a nurse to answer to receive report.  Spoke with Wayland Denishonda DON of [facility] and was informed that they do not admit hospice patients. They have hospice long term patients but not short term. They were under the impression that patient was coming there for rehab. Total length of time on the phone was 23 minutes.

## 2013-10-22 NOTE — Progress Notes (Signed)
Clinical social worker assisted with patient discharge to skilled nursing facility, [facility].  CSW addressed all family questions and concerns. CSW copied chart and added all important documents. CSW also set up patient transportation with [facility]. Clinical Social Worker will sign off for now as social work intervention is no longer needed.   Lachlyn Vanderstelt, MSW, LCSWA [phone] 

## 2013-10-22 NOTE — Progress Notes (Signed)
CSW made a referral to [facility] for a hospice bed.  Sabino NiemannAmy Gilbert Man[place], MSW, [employer]CSWA [phone]

## 2013-10-22 NOTE — Progress Notes (Signed)
CSW contacted Patient's daughter. She is not agreeable to [facility]. She stated that it would be too far to drive and would cause a lot of stress on her family.    Sabino NiemannAmy Demarlo Riojas, MSW, [employer]CSWA [phone]

## 2013-10-22 NOTE — Discharge Summary (Signed)
Physician Discharge Summary  Patient ID: Wendy Malone MRN: [id number] DOB/AGE: [birth year] 73 y.o.  Admit date: 10/11/2013 Discharge date: 10/22/2013  Primary Care Physician:  Glendon Axe, MD  Discharge Diagnoses:   . 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 Atrial fibrillation with RVR . NSTEMI . Acute diastolic heart failure . Anemia . Hyperosmolality and/or hypernatremia Dysphagia  Consults:  Cardiology, Dr Debara Pickett                    Neurology/stroke service  Palliative medicine, Dr. Billey Chang                     Recommendations for Outpatient Follow-up:  patient is comfort care   Diet: Comfort feeds  Allergies:  No Known Allergies   Discharge Medications:   Medication List    STOP taking these medications       aspirin 81 MG tablet     metoprolol tartrate 25 MG tablet  Commonly known as:  LOPRESSOR     ranitidine 150 MG tablet  Commonly known as:  ZANTAC      TAKE these medications       cloNIDine 0.2 mg/24hr patch  Commonly known as:  CATAPRES - Dosed in mg/24 hr  Place 1 patch (0.2 mg total) onto the skin once a week.     levalbuterol 0.63 MG/3ML nebulizer solution  Commonly known as:  XOPENEX  Take 3 mLs (0.63 mg total) by nebulization every 3 (three) hours as needed for wheezing or shortness of breath.     morphine CONCENTRATE 10 mg / 0.5 ml concentrated solution  Take 0.25 mLs (5 mg total) by mouth every 2 (two) hours as needed for severe pain or shortness of breath.         Brief H and P: For complete details please refer to admission H and P, but in brief 73 years old female with PMH relevant for HTN and GERD. Presented with right hemiparesis and facial droop. Last seen normal the day prior. Out of window for TPA. Awake, alert, oriented and protecting her airway. Hemodynamically stable. Intermittent rapid A. Fib on amiodarone drip. DNR/DNI.   Hospital Course:  Patient is a 73 year old female with a past medical  history of hypertension, admitted on 10/11/2013 presented as a transfer from [facility] where she had presented with right hemiparesis and facial droop. She was not administered TPA on admission as if she was outside therapeutic window. MRI of the brain revealed acute nonhemorrhagic infarct involving the posterior left lentiform nucleus extending superiorly into the corona radiata. Neurology was consulted. Labs also revealed elevated troponins have a peak troponin of 4.85 as cardiology was consulted as well. Transthoracic echocardiogram performed on 10/12/2013 showed an ejection fraction of 60-65% without wall motion abnormalities. During this hospitalization she had episodes of atrial fibrillation with rapid ventricular response as a was felt that CVA may have represented thromboembolic phenomenon. Patient having an overall poor functional status, palliative care consulted. Family members agreed to transition to hospice   CVA (cerebral infarction)/acute ischemic left basal ganglia stroke with right hemiparesis likely precipitated due to atrial fibrillation. Patient was closely followed by neurology service. She underwent complete stroke workup. MRI of the brain showed acute nonhemorrhagic infarct involving the posterior left lentiform nucleus extending superiorly into the corona radiata. Additional remote lacunar infarcts are present in the basal ganglia, brainstem, inferior cerebellum bilaterally. 3. Atrophy and diffuse white matter changes are present bilaterally including the area  of acute infarct.  She was placed on aspirin suppository due to dysphagia for secondary stroke prevention.  2-D echo showed normal LV size and systolic function EF 14-43%. Carotid Dopplers showed 60-79% right internal carotid artery stenosis, and 1-39% left internal carotid artery stenosis.  Patient was also followed closely by cardiology and recommended new anticoagulant agents due to atrial fibrillation  causing this acute CVA however patient was not able to swallow. She was started on NG tube feedings, subsequently discontinued after palliative goals of care.  Dysphagia  - GI consult was called for anemia workup and possible PEG tube placement. Per neurology, prognosis for full recovery is poor and will need long-term placement. Patient was evaluated by gastroenterology and did not feel any workup needed for anemia at this time. Patient had declined endoscopy outpatient when she was recommended by her PCP in December 2014.  Patient was started on dysphagia diet, and did not do well with the diet, NG tube was placed however she pulled it out x2. It was replaced back third time, currently receiving free water and tube feeds. Palliative medicine consult was obtained for goals of care. Patient's daughter met with Dr. Lovena Le and requested for a full comfort care. Patient's daughter relates that her mother had never been to physicians her whole life, did not want this aggressive management or artificial feeding at this time.  Uncontrolled hypertension - Placed on clonidine patch, she was placed on scheduled IV Lopressor   Acute respiratory failure - Resolved, likely due to pulmonary edema  - 2-D echo was done 3/1 which had shown EF of 60-65% with grade 1 diastolic dysfunction   Acute diastolic CHF with NSTEMI, Atrial fibrillation - Paroxysmal, not on anticoagulation, currently Comfort Care goals cardiology was consulted during the hospitalization.  Anemia  - B12 folate normal, 42, ferritin 92. I had discussed in detail with the patient's daughter on 3/2, her mother did not want any invasive procedures/colonoscopy when her primary care physician had suggested In December 2014.   Hypernatremia with Acute kidney failure last creatinine 1.5 on 10/20/2013.      Day of Discharge BP 178/72  Pulse 99  Temp(Src) 97.2 F (36.2 C) (Oral)  Resp 18  Ht 5' (1.524 m)  Wt 65.273 kg (143 lb 14.4 oz)  BMI  28.10 kg/m2  SpO2 98%  Physical Exam: General: Alert and awake, not in any acute distress, dysarthria.  CVS: S1-S2 clear, no murmur rubs or gallops  Chest: clear to auscultation bilaterally, no wheezing, rales or rhonchi  Abdomen: soft nontender, nondistended, normal bowel sounds  Extremities: no cyanosis, clubbing or edema noted bilaterally  Neuro : Dysarthria with facial drooping and right-sided hemiparesis      The results of significant diagnostics from this hospitalization (including imaging, microbiology, ancillary and laboratory) are listed below for reference.    LAB RESULTS: Basic Metabolic Panel:  Recent Labs Lab 10/17/13 0450 10/18/13 0527 10/20/13 0810  NA 155* 152* 152*  K 3.5* 3.4* 3.7  CL 117* 114* 115*  CO2 _0 GLUCOSE 212* 208* 190*  BUN 70* 88* 75*  CREATININE 1.91* 2.04* 1.55*  CALCIUM 9.1 9.4 8.2*  MG 3.0*  --   --    Liver Function Tests:  Recent Labs Lab 10/17/13 0450  AST 22  ALT 36*  ALKPHOS 48  BILITOT 0.4  PROT 5.6*  ALBUMIN 2.7*   No results found for this basename: LIPASE, AMYLASE,  in the last 168 hours No results found for this  basename: AMMONIA,  in the last 168 hours CBC:  Recent Labs Lab 10/17/13 0450 10/18/13 0527  WBC 5.0 6.8  HGB 8.0* 9.8*  HCT 25.4* 30.5*  MCV 93.4 92.4  PLT 200 237   Cardiac Enzymes: No results found for this basename: CKTOTAL, CKMB, CKMBINDEX, TROPONINI,  in the last 168 hours BNP: No components found with this basename: POCBNP,  CBG:  Recent Labs Lab 10/21/13 1133 10/21/13 1621  GLUCAP 112* 94    Significant Diagnostic Studies:  Ct Angio Head W/cm &/or Wo Cm  10/11/2013   CLINICAL DATA:  Code stroke. Right-sided weakness and slurred speech.  EXAM: CT ANGIOGRAPHY HEAD  TECHNIQUE: Multidetector CT imaging of the head was performed using the standard protocol during bolus administration of intravenous contrast. Multiplanar CT image reconstructions and MIPs were obtained to evaluate  the vascular anatomy.  CONTRAST:  32m OMNIPAQUE IOHEXOL 350 MG/ML SOLN  COMPARISON:  None.  FINDINGS: Gray-white differentiation is preserved the source images and postcontrast images without evidence for a focal cortical infarct. There are multiple lacunar infarcts within the basal ganglia bilaterally which are age-indeterminate. Extensive periventricular white matter hypoattenuation is present bilaterally.  No hemorrhage or mass lesion is evident.  The cervical internal carotid arteries are within normal limits bilaterally. Calcified and noncalcified plaque is present within the cavernous carotid arteries bilaterally. There is a moderate stenosis of the right cavernous carotid artery just proximal to the genu. Mild to moderate narrowing is present in the supra clinoid left internal carotid artery. Supra clinoid segments are normal bilaterally. The A1 and M1 segments are normal bilaterally. The anterior communicating artery is patent.  Segmental narrowing within the pericallosal arteries is worse on the right. There is segmental narrowing of distal MCA branch vessels bilaterally, right greater than left.  The left vertebral artery is dominant. The right PICA origin is visualized and normal. The left AICA is dominant. The basilar artery is within normal limits. Both posterior cerebral arteries originate from the basilar tip. Segmental stenoses are present in the proximal PCA branch vessels bilaterally. The dural sinuses are patent.  Review of the MIP images confirms the above findings.  IMPRESSION: 1. No acute or subacute cortical infarct is evident. 2. Bilateral basal ganglia infarcts are age indeterminate. 3. 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 4. Segmental stenoses within the PCA branch vessels bilaterally and right greater than left MCA branch vessels. 5. Segmental stenoses within the pericallosal arteries, right greater than left.   Electronically Signed   By: CLawrence SantiagoM.D.   On: 10/11/2013 21:31   Dg Chest 2 View  10/11/2013   CLINICAL DATA:  Stroke.  EXAM: CHEST  2 VIEW  COMPARISON:  None.  FINDINGS: Cardiac silhouette is mildly enlarged. Normal mediastinal and hilar contours.  There are prominent bronchovascular markings. Lungs are otherwise clear. No pleural effusion or pneumothorax.  The bony thorax is intact.  IMPRESSION: No acute cardiopulmonary disease.   Electronically Signed   By: DLajean ManesM.D.   On: 10/11/2013 21:03   Ct Head Wo Contrast  10/12/2013   CLINICAL DATA:  Altered mental status.  Unable to speak.  EXAM: CT HEAD WITHOUT CONTRAST  TECHNIQUE: Contiguous axial images were obtained from the base of the skull through the vertex without intravenous contrast.  COMPARISON:  CTA of the head performed 10/11/2013  FINDINGS: There is no evidence of acute infarction, mass lesion, or intra- or extra-axial hemorrhage on CT.  Diffuse periventricular and subcortical white  matter change likely reflects small vessel ischemic microangiopathy. Scattered chronic lacunar infarcts are seen within the basal ganglia bilaterally.  The posterior fossa, including the cerebellum, brainstem and fourth ventricle, is within normal limits. The third and lateral ventricles are unremarkable in appearance. The cerebral hemispheres demonstrate grossly normal gray-white differentiation. No mass effect or midline shift is seen.  There is no evidence of fracture; visualized osseous structures are unremarkable in appearance. The visualized portions of the orbits are within normal limits. The paranasal sinuses and mastoid air cells are well-aerated. No significant soft tissue abnormalities are seen.  IMPRESSION: 1. No acute intracranial pathology seen on CT. 2. Diffuse small vessel ischemic microangiopathy again noted; scattered chronic lacunar infarcts within the basal ganglia bilaterally.   Electronically Signed   By: Garald Balding M.D.   On: 10/12/2013 03:26   Mr Brain Wo  Contrast  10/12/2013   CLINICAL DATA:  Slurred speech. Ischemic stroke. Right hemiparesis and facial droop.  EXAM: MRI HEAD WITHOUT CONTRAST  TECHNIQUE: Multiplanar, multiecho pulse sequences of the brain and surrounding structures were obtained without intravenous contrast.  COMPARISON:  CT head without contrast 10/12/2013.  FINDINGS: Diffusion-weighted images demonstrate an acute nonhemorrhagic infarct involving the posterior left lentiform nucleus extending into the body of caudate and coronal radiata. The area measures 3.2 x 2.5 x 1.4 cm. The area is present within the region of diffuse white matter disease. Advanced atrophy and extensive white matter changes are present bilaterally. Remote lacunar infarcts are present in the brainstem and inferior cerebellum bilaterally. Remote lacunar infarcts are present in the basal ganglia bilaterally as well.  Flow is present in the major intracranial arteries. The globes and orbits are intact. Mild mucosal thickening is present in the sphenoid sinuses bilaterally. There is minimal mucosal thickening in the ethmoid air cells and inferior left frontal sinus. The mastoid air cells are clear.  IMPRESSION: 1. Acute nonhemorrhagic infarct involving the posterior left lentiform nucleus extending superiorly into the corona radiata. 2. Additional remote lacunar infarcts are present in the basal ganglia, brainstem, inferior cerebellum bilaterally. 3. Atrophy and diffuse white matter changes are present bilaterally including the area of acute infarct. This likely reflects the sequelae of chronic microvascular ischemia. 4. Minimal sinus disease. These results were called by telephone at the time of interpretation on 10/12/2013 at 11:55 AM to Dr. Irish Elders, who verbally acknowledged these results.   Electronically Signed   By: Lawrence Santiago M.D.   On: 10/12/2013 11:58    2D ECHO: Study Conclusions  - Left ventricle: The cavity size was normal. Wall thickness was increased in a  pattern of moderate LVH. Systolic function was normal. The estimated ejection fraction was in the range of 60% to 65%. Wall motion was normal; there were no regional wall motion abnormalities. Doppler parameters are consistent with abnormal left ventricular relaxation (grade 1 diastolic dysfunction). - Aortic valve: There was no stenosis. - Mitral valve: Mildly calcified annulus. Mildly calcified leaflets . No significant regurgitation. - Left atrium: The atrium was mildly dilated. - Right ventricle: The cavity size was normal. Systolic function was normal. - Pulmonary arteries: No complete TR doppler jet so unable to estimate PA systolic pressure. - Inferior vena cava: The vessel was normal in size; the respirophasic diameter changes were in the normal range (= 50%); findings are consistent with normal central venous pressure. - Pericardium, extracardiac: A trivial pericardial effusion was identified.    Disposition and Follow-up: Discharge Orders   Future Orders Complete By Expires  Discharge instructions  As directed    Comments:     Diet: COMFORT FEEDS       DISPOSITION: residential   hospice  DIET:  comfort feeds    DISCHARGE FOLLOW-UP Follow-up Information   Schedule an appointment as soon as possible for a visit in 10 days to follow up. (As needed, If symptoms worsen)    Contact information:   FACILITY MD       Time spent on Discharge:  45 minutes   Signed:   Ileen Kahre M.D. [facility] 10/22/2013, 12:18 PM Pager: [phone]

## 2013-10-22 NOTE — Consult Note (Signed)
[facility] Liaison: Received request from CSW for family interest in [facility]. Unfortunately still no availability today. Will follow, update CSW if something becomes available. Thank you. Valeria Krisko LCSW [phone] 

## 2013-10-22 NOTE — Clinical Social Work Placement (Signed)
Clinical Social Work Department  CLINICAL SOCIAL WORK PLACEMENT NOTE   Patient: Wendy Cisconn SmithAccount Number: [id number]   Admit date: 10/11/13 Clinical Social Worker: Sabino NiemannAmy Chereese Cilento LCSWA Date/time: 10/22/2013 2:00 PM Clinical Social Work is seeking post-discharge placement for this patient at the following level of care: SKILLED NURSING (*CSW will update this form in Epic as items are completed)  10/22/2013 [facility]/family provided with [facility] of Clinical Social Work's list of facilities offering this level of care within the geographic area requested by the patient (or if unable, by the patient's family).  10/22/2013 Patient/family informed of their freedom to choose among providers that offer the needed level of care, that participate in Medicare, Medicaid or managed care program needed by the patient, have an available bed and are willing to accept the patient.  3/11/2015Patient/family informed of [facility]' ownership interest in [facility], as well as of the fact that they are under no obligation to receive care at this facility.  PASARR submitted to EDS on 10/22/2013 PASARR number received from EDS on 10/22/2013  FL2 transmitted to all facilities in geographic area requested by pt/family on 10/22/2013 FL2 transmitted to all facilities within larger geographic area on  Patient informed that his/her managed care company has contracts with or will negotiate with certain facilities, including the following:  Patient/family informed of bed offers received: 10/22/2013 [facility] Physician recommends and patient chooses bed at  Patient to be transferred to on 10/22/2013 [facility] The following physician request were entered in Epic:  Additional Comments:

## 2013-10-22 NOTE — Progress Notes (Signed)
Request for collaboration for disposition assistance with CSW.  Patient daughter returned call to CSW indicating that she spoke with [facility] and would agree to d/c to their facility.  I spoke with Clydie BraunKaren at [facility] and after discussion she indicted that they were indeed in network with the patients insurance, could admit and complete assessment for needs upon admission.  They work very closely with [facility] and could provide seamless referral to hospice services either at their facility or when [facility] became available [facility].  Information returned to CSW, Dr. Ladona Ridgelaylor made aware of potential plan, she agrees as this appears to meet the daughters expectations for comfort.  Dr. Isidoro Donningai completed d/c summary  Wendy PresumeSue Ellen Copeland Neisen, RN, MSN, [facility] Palliative Integration

## 2013-10-27 ENCOUNTER — Inpatient Hospital Stay: Payer: Self-pay | Admitting: Specialist

## 2013-10-27 LAB — COMPREHENSIVE METABOLIC PANEL
ALK PHOS: 99 U/L
ALT: 52 U/L (ref 12–78)
Albumin: 2.7 g/dL — ABNORMAL LOW (ref 3.4–5.0)
BUN: 63 mg/dL — AB (ref 7–18)
Bilirubin,Total: 0.5 mg/dL (ref 0.2–1.0)
CREATININE: 1.52 mg/dL — AB (ref 0.60–1.30)
Calcium, Total: 9.2 mg/dL (ref 8.5–10.1)
Co2: 23 mmol/L (ref 21–32)
EGFR (African American): 40 — ABNORMAL LOW
GFR CALC NON AF AMER: 34 — AB
GLUCOSE: 78 mg/dL (ref 65–99)
POTASSIUM: 4.2 mmol/L (ref 3.5–5.1)
SGOT(AST): 23 U/L (ref 15–37)
Sodium: >160 mmol/L (ref 136–145)
Total Protein: 6.1 g/dL — ABNORMAL LOW (ref 6.4–8.2)

## 2013-10-27 LAB — CBC WITH DIFFERENTIAL/PLATELET
BASOS ABS: 0.1 10*3/uL (ref 0.0–0.1)
Basophil %: 0.7 %
EOS ABS: 0.2 10*3/uL (ref 0.0–0.7)
Eosinophil %: 1.9 %
HCT: 33.5 % — ABNORMAL LOW (ref 35.0–47.0)
HGB: 10.4 g/dL — ABNORMAL LOW (ref 12.0–16.0)
LYMPHS ABS: 0.9 10*3/uL — AB (ref 1.0–3.6)
LYMPHS PCT: 11.3 %
MCH: 29.9 pg (ref 26.0–34.0)
MCHC: 31.1 g/dL — ABNORMAL LOW (ref 32.0–36.0)
MCV: 96 fL (ref 80–100)
Monocyte #: 0.4 x10 3/mm (ref 0.2–0.9)
Monocyte %: 5.4 %
Neutrophil #: 6.7 10*3/uL — ABNORMAL HIGH (ref 1.4–6.5)
Neutrophil %: 80.7 %
PLATELETS: 142 10*3/uL — AB (ref 150–440)
RBC: 3.48 10*6/uL — ABNORMAL LOW (ref 3.80–5.20)
RDW: 19 % — AB (ref 11.5–14.5)
WBC: 8.3 10*3/uL (ref 3.6–11.0)

## 2013-11-12 ENCOUNTER — Ambulatory Visit: Payer: Self-pay | Admitting: Internal Medicine

## 2013-11-12 DEATH — deceased

## 2014-08-27 IMAGING — DX DG ABD PORTABLE 1V
1 series · 1 of 1 positions shown · non-contrast
Comparison: None.

CLINICAL DATA: NG tube placement

EXAM:
PORTABLE ABDOMEN - 1 VIEW

[supine ap]
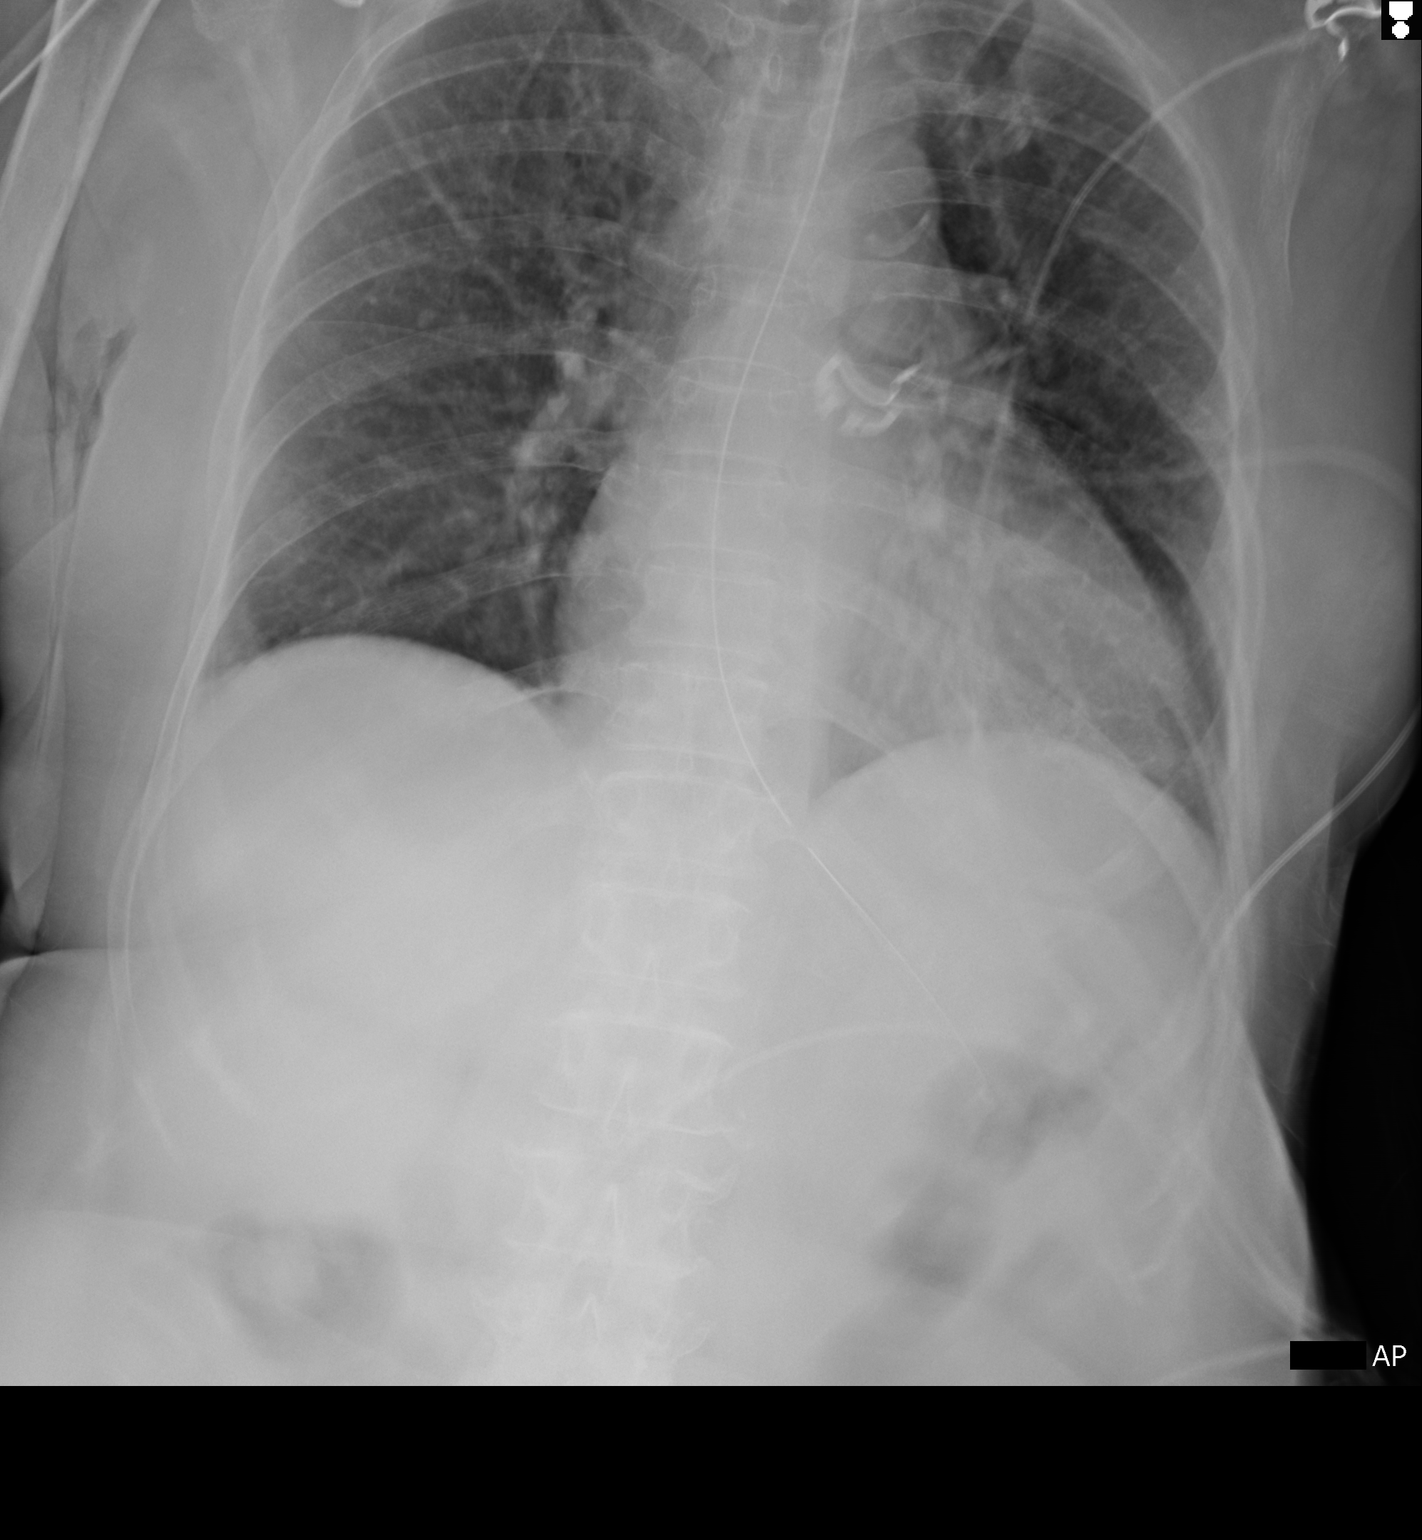

[1 of 1 positions shown; findings below may reference images not displayed]

FINDINGS: Nasogastric tube terminates in the proximal stomach. The side port
is at the GE junction. The upper abdominal bowel gas pattern is
unremarkable. Mild atelectasis in the retrocardiac lung.
IMPRESSION: Nasogastric tube which ends in the proximal stomach. The side port
is at the GE junction, advancement by 3 cm may improve catheter
function.

## 2014-12-04 NOTE — H&P (Signed)
PATIENT NAME:  Wendy Malone, Wendy Malone MR#:  [id number] DATE OF BIRTH:  [birth year]  DATE OF ADMISSION:  07/28/2013  PRIMARY CARE PHYSICIAN: Supposed to be Wendy Kays, MD, but she does not really see any physician.   CHIEF COMPLAINT: Difficulty with speech.   HISTORY OF PRESENT ILLNESS: This is a very pleasant 74 year old female with a history of tobacco dependence who presents with the above complaint. The patient says that she was Wendy Malone's shopping today. When she was checking out, she was unable to get her words out. She knew what she wanted to say but she was unable to get her words out. She had a friend that worked at [employer] who called the daughter because she was concerned of her speech issues. The daughter drove her here to the EMS. When she arrived, her blood pressure was systolic over 413. She continues to have some kind of issues with her speech at times throughout her ER stay here.   Her daughter also noticed that she has been kind of disoriented today as well. There is no other neurological deficit.   REVIEW OF SYSTEMS:  CONSTITUTIONAL: No fever, fatigue, weakness, weight loss, weight gain.  EYES: No blurred or double vision. 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  ENT: No ear pain, hearing loss, seasonal allergies, postnasal drip.  RESPIRATORY: No cough, wheezing, hemoptysis or painful respirations.  CARDIOVASCULAR: No chest pain, palpitations, orthopnea, syncope, edema, arrhythmia, dyspnea on exertion. GASTROINTESTINAL: No nausea, vomiting, diarrhea, abdominal pain, melena, or ulcers. GENITOURINARY: No dysuria, hematuria.  ENDOCRINE: No polyuria or polydipsia.  HEMOLYMPHATIC: No anemia or easy bruising, bleeding, swollen glands.  SKIN: No rash or lesions.  MUSCULOSKELETAL: Some mild arthritis. Limited activity due to back pain.  NEUROLOGIC: No history of CVA, TIA, or seizures.  PSYCHIATRIC: No history of anxiety or depression.   PAST MEDICAL HISTORY: None.  MEDICATIONS: None.    ALLERGIES: No known drug allergies.   PAST SURGICAL HISTORY: None.   FAMILY HISTORY: Positive for CAD.   SOCIAL HISTORY: The patient smokes 1 pack a day and no alcohol or IV drug use.   PHYSICAL EXAMINATION:  VITAL SIGNS: Temperature 98.6, pulse 84, respirations 18, blood pressure 210/91, 97% on room air.  GENERAL: Alert, oriented, not in acute distress.  HEENT: Head is atraumatic. Pupils are round and reactive. Sclerae anicteric. Mucous membranes are moist. The patient has very poor dentition.  OROPHARYNX: Clear.  NECK: Supple without JVD, carotid bruit or enlarged thyroid.  RESPIRATORY: Good respiratory effort. Clear to auscultation. Clear to percussion. Symmetrical rise and fall of the chest. Symmetrical expansion. No egophony.  CARDIOVASCULAR: Regular rate and rhythm. No murmurs, gallops or rubs.  CARDIAC: Palpation is within normal limits.  GASTROINTESTINAL: No tenderness or mass. No hepatosplenomegaly. No hernia. Bowel sounds are equal x4.  ABDOMEN: Nondistended. No rebound, guarding, rigidity or ecchymosis.  MUSCULOSKELETAL: No pathology to digits or nails.  EXTREMITIES: Move x4. No tenderness or effusion. Range of motion is adequate.  NEUROLOGIC: Cranial nerves II through XII are intact. Reflexes are 2+ bilaterally and symmetrically. She actually did not do the heel-to-shin on the right leg and the left shin. That is the only cerebellar abnormality that is noted.   LABORATORIES: Troponin is 0.10. White blood cells 6.4, hemoglobin 12, hematocrit 35.2, platelets of 214. Sodium 134, potassium 4.1, chloride 104, bicarb 23, BUN 26, creatinine 1.14, glucose 108, calcium 9.7, bilirubin 0.4, alk phos 82, ALT 20, AST 19, total protein 7.0, albumin 3.6.   CT of the head  shows no acute intracranial hemorrhage or CVA. She has atrophy and extensive white matter disease. She has bilateral lacunar infarcts in the basal ganglia, likely remote.   EKG: Sinus tachycardia with PACs.   ASSESSMENT  AND PLAN: A 74 year old female who presented with trouble getting her words out and also was noted to have malignant hypertension.  1.  Cerebrovascular accident. It appears that the patient may have suffered a cerebrovascular accident. She is unable to get all her words out clearly. She has a cerebellar abnormality as well; therefore, I have obtained an MRI, echocardiogram, and carotid Dopplers. I have started the patient on aspirin, checking fasting lipids, speech consultation, and a physical therapy consultation as well. Further workup after the results pending have been completed.  2.  Malignant hypertension. I do not know if this is from her stroke or if she has underlying hypertension. She does not see a primary care physician. She does have a nurse that comes to her house once a year in February. Her blood pressure was slightly elevated as per the patient. In any case, due to problem #1, I will keep the blood pressure systolic about 800 to 349, follow up on the MRI.  3.  Elevated troponin, likely supply/demand ischemia from problems #1 and #2. I have ordered echocardiogram, follow on telemetry and follow cardiac enzymes.  4.  Smoking dependence. We encouraged the patient to stop smoking. The patient was counseled for 3 minutes. She is not interested at all in quitting smoking. She is interested in a nicotine patch which I have ordered.   CODE STATUS: FULL CODE STATUS.   FOLLOWUP: The patient should follow up with Wendy Kays, MD, at discharge.   CRITICAL CARE TIME SPENT: Approximately 50 minutes.   ____________________________ Wendy Beers. Benjie Karvonen, MD spm:np D: 07/28/2013 17:41:00 ET T: 07/28/2013 18:52:31 ET JOB#: [id number]  cc: Eldonna Malone P. Benjie Karvonen, MD, <Dictator> Wendy Evangelist, MD Wendy Beers Wendy Furnish MD ELECTRONICALLY SIGNED 07/28/2013 19:39

## 2014-12-04 NOTE — Discharge Summary (Signed)
PATIENT NAMKatrinka Malone:  [facility] MR#:  [id number] DATE OF BIRTH:  09-20-1940  DATE OF ADMISSION:  07/28/2013 DATE OF DISCHARGE:  07/30/2013  PRIMARY CARE PHYSICIAN:  Yetta FlockAileen H. Miller, MD  FINAL DIAGNOSES: 1.  Transient ischemic attack versus hypertensive encephalopathy.  2.  Malignant hypertension.  3.  Carotid stenosis.  4.  Cardiomyopathy.  5.  Tobacco abuse.  6.  Hypomagnesemia.  7.  Chronic kidney disease.  8.  Memory loss.   MEDICATIONS ON DISCHARGE: Include aspirin 81 mg daily, metoprolol tartrate 25 mg twice a day, lisinopril 10 mg daily, nicotine patch 21 mg per chest wall 1 patch daily, simvastatin 5 mg at bedtime.   HOME HEALTH: Physical therapy and nurse.   DIET: Low sodium diet, regular consistency.   ACTIVITY: As tolerated.   FOLLOWUP: In 1 to 2 weeks with Dr. Silver HugueninAileen Miller.   HOSPITAL COURSE:  The patient was admitted 07/28/2013 and discharged 07/30/2013, came in with difficulty with speech, was admitted for suspected stroke. MRI of the brain, echocardiogram and carotid ultrasound ordered.  The patient also had malignant hypertension and elevated troponin.   LABORATORY, DIAGNOSTIC AND RADIOLOGICAL DATA DURING THE HOSPITAL COURSE: Included a CT scan of the head that showed atrophy and extensive white matter changes. Troponin 0.1. White blood cell count 6.4, H and H 12.0 and 35.2, platelet count of 214. Glucose 108, BUN 26, creatinine 1.14, sodium 134, potassium 4.1, chloride 104, CO2 23, calcium 9.7. Liver function tests normal. Chest x-ray atherosclerotic calcification in both carotid arteries. EKG sinus tachycardia with premature atrial complexes, nonspecific ST-T wave changes. Urinalysis negative. Ultrasound of the carotids showed right-sided atherosclerotic plaque, 50% to 69% left-sided plaque.  Next troponin borderline at 0.12, next borderline at 0.11. TSH 1.76. LDL 92, HDL 56, triglycerides 125. Hemoglobin A1c 5.4. Magnesium 1.6 creatinine on the 16th bumped up to 1.62,  creatinine upon discharge 1.35. Echocardiogram showed an EF of 40% to 45%. MRI of the brain without contrast showed no acute infarct, prominent white matter-type changes, remote infarcts in the past basal ganglia. MRA of the carotid shows 60% stenosis right internal carotid, left internal carotid 54% stenosis. B12 and RPR are pending.    FINAL DIAGNOSES: 1.  Transient ischemic attack versus hypertensive encephalopathy. Unclear what this is. Patient had an episode of slurred speech, no power abnormalities or sensory abnormalities. I am going to treat this like a stroke with aspirin and cholesterol medication, simvastatin. Blood pressure did come down with treatment, 162/77 upon discharge. I did recommend low salt diet. Cholesterol medication.  2.  Malignant hypertension. Blood pressure very high on presentation. Blood pressure trended down with low-dose metoprolol and lisinopril. Continue to monitor next week as outpatient. The  hypertensive encephalopathy could be causing the patient's slurred speech. This had improved as blood pressure came down. I want to avoid too low a blood pressure and start low with the medications, titrate up as outpatient.  3.  Carotid stenosis. Aspirin, Zocor prescribed. Will need followup sonograms as outpatient, maybe even a CT angio as outpatient. This needs yearly followup.  4.  Cardiomyopathy. The metoprolol and lisinopril should help out with this.  5.  Tobacco abuse. Smoking cessation counseling done during the hospitalization. Nicotine patch applied.  6.  Hypomagnesemia. Replaced during the hospital course.  7.  Chronic kidney disease stage 3. Recommend following a BMP as outpatient in 1 week.  8.  Memory loss. Vitamin B12 and RPR are still pending at the time of discharge. Followup as an outpatient  is needed on this. Home health set up for patient, PT and RN, to help out with meds and strength.   TIME SPENT ON DISCHARGE: 40 minutes.     ____________________________ Herschell Dimes. Renae Gloss, MD rjw:cs D: 07/30/2013 15:06:27 ET T: 07/30/2013 20:22:04 ET JOB#: [id number]  cc: Herschell Dimes. Renae Gloss, MD, <Dictator> Yetta Flock, MD Salley Scarlet MD ELECTRONICALLY SIGNED 08/01/2013 17:01

## 2014-12-05 NOTE — Consult Note (Signed)
PATIENT NAME:  Wendy [facility], [facility] MR#:  [id number] DATE OF BIRTH:  07-29-1941  DATE OF CONSULTATION:  10/11/2013  REFERRING PHYSICIAN:   CONSULTING PHYSICIAN:  Felipa Furnaceoberto Sanchez Gutierrez, MD  PRIMARY CARE PHYSICIAN:  Dr. Silver HugueninAileen Miller, but she does not really follow with any physicians.  She is the one on the list.    CHIEF COMPLAINT:  Found down with new developing during the Emergency Room evaluations.    HISTORY OF PRESENT ILLNESS:  This is a very nice 74 year old female who has been recently admitted to the hospital back in December with a history of difficulty with speech.   The patient was discharged on December 17th with a diagnosis of transient ischemic attack versus hypertensive encephalopathy.  At that moment, the patient had a CT scan that showed atrophy extensive with white matter changes.  She had an ultrasound of the carotids to show right side atherosclerotic plaque of 50% to 69% and the left side actually minimal left atherosclerotic plaque, mostly right-sided 50% to 69%.  The patient had also an echocardiogram that showed left ventricular ejection fraction 40% to 45% with mild decreased global left ventricular systolic function.  Normal right ventricular function.  The patient was discharged on metoprolol and lisinopril, aspirin and simvastatin.  The patient is not taking any of those medications or the metoprolol.  She is only taking the aspirin 81 mg, but the family is not sure if she is taking all her medications because she really does not like to take them.  The patient was found down today at 11:00 a.m.  She was not able to get up.  She was wet, but there was no stool on her so it is hard to say how long she was down.  The last time that she was seen prior to that was Wednesday by her daughter and she was overall normal.  Apparently the patient was moving all the four extremities.  She did not have any facial droop during the first examination by EMS, by her daughter and by the ER physician.   The patient actually was cooperating and tried to get out of the floor whenever the EMS came to pick her up and here in the Emergency Department she was cooperating and moving all her extremities with normal strength within the last hour.   Whenever I came to visit with the patient she was admitted for possible non-ST-elevation MI due to a troponin elevation of 6.9.  At that moment the patient had neglect of the right side.  She had right side hemiparesis and a facial droop on the right as well.  She was very flaccid with no DTRs.  She was able to move her left side with normal strength arm and leg.  So by the time that I examined the patient her NIHSS is around 16.  She has a right facial droop and palsy.  She has a right lower and upper extremity palsy and neglect of the right side with decreased sensation on the right side both extremities.  Her left upper and lower extremities have normal strength.  The patient has been given a bolus of heparin within the last 30 minutes.  Apparently this happened just right after she started developing these symptoms for what I can gauge from the family, for what I doubt that this is acute bleeding.  Since the patient received already heparin bolus we cannot do t-PA despite the fact that she is in the window.  Her blood pressure is also  on the 200s now for what she is the perfect patient for intravascular t-PA, as recommended also by Dr. Loretha Brasil.  The case was discussed also with Dr. Leroy Kennedy at [facility].  The patient is going to be transferred to [facility] for further evaluation and treatment.  The patient is going to need an arteriogram of the brain although doing that over here will delay her care and her transfer for what everything is going to be done in the ER.  I also discussed the case with Dr. Lynelle Doctor who is the Emergency Department physician who will be accepting the patient.   REVIEW OF SYSTEMS:  Unable to obtain due to dysarthria and aphasia.   PAST MEDICAL HISTORY: 1.   TIA.  2.  Hypertension.  3.  Very mild dementia.  4.  History of malignant hypertension.  5.  Carotid stenosis of the right side 50% to 69%.  6.  History of cardiomyopathy with ejection fraction of 40% to 45%.  7.  Systolic CHF.  8.  Tobacco abuse, now quit.  9.  Chronic kidney disease.   ALLERGIES:  No known drug allergies.   PAST SURGICAL HISTORY:  None.   MEDICATIONS:  The patient was discharged on lisinopril, statin, beta blocker and aspirin and currently she is just taking her aspirin occasionally and her metoprolol.  I do not know if those medications were stopped by someone, either her primary care physician or herself.   FAMILY HISTORY:  Negative for coronary artery disease.  The patient's daughter denied any history of MIs in the family or heart disease.  No cancer.   SOCIAL HISTORY:  The patient used to smoke 1 pack of cigarettes a day and she quit tobacco in December when she was admitted here.  She does not drink.  She does not use drugs.  She actually drives and she takes care of most of her finances.  She cooks some and she gets around the house without a problem.  She has been living independently up until now.   PHYSICAL EXAMINATION: VITAL SIGNS:  Blood pressure, it is 180s to 190s, up to 80s.  The last blood pressure was above 200/80, but right now treated.  Her pulse was 97, respiratory rate 20, temperature 98.8, oxygen saturation 97% on room air.  The patient is awake and she seems to be cooperating with some of most commands.  When she is told to tell me her name she would say her name.  She does not know where she is.  She knows it is a hospital, but she has problems finding words.  She was able to tell me her daughter's name, but she cannot name objects.  She does not recognize the right side of her body as her own.  She could not recognize her hand and she is thinking that she is lifting her hand when it is commanded to do it.  She has a right droop, right upper and  lower extremity hemiparesis.  Her tone is completely flaccid on the right side.  Her left side has strength of 5 out of 5 with good tone and good DTRs.  There is no DTRs able to be obtained on her right side.  The patient has very decreased sensation.  Her right Babinski is upright.  Her left Babinski is downright.  The patient withdraws to pain on the left side, but not on the right side.  Unable to do cerebellar test.  Her pupils are equal and reactive.  Extraocular  movements are intact.  The patient is able to open and close both eyes.  She does not look to be in any distress.   GENERAL:  The patient is stable, but with significant neurologic changes happening within the last hour.  No oral lesions.  No oropharyngeal exudates.  Her tongue is slightly deviated to the right and her uvula is slightly deviated to the right.  No oral lesions.  NECK:  Supple.  No JVD.  No thyromegaly.  No adenopathy.  No carotid bruits despite the fact that she has a stenosis of 50% to 60% on the right side.  No rigidity.  CARDIOVASCULAR:  Regular rate and rhythm.  No murmurs, rubs, or gallops.  Whenever I was examining her, I noticed a short run of tachycardia which looked regular, though it could the new development of atrial fibrillation.  The EKG does not show any atrial fibrillation at this moment.  LUNGS:  Clear without any wheezing or crepitus.  No use of accessory muscles.  ABDOMEN:  Soft, nontender, nondistended.  No hepatosplenomegaly.  No masses.  Bowel sounds are positive.  EXTREMITIES:  No edema, cyanosis or clubbing.  Pulses +2.  Capillary refill less than 3.  SKIN:  No rashes, petechiae or new lesions.  LYMPHATIC:  Negative for lymphadenopathy in the neck or supraclavicular areas.  MUSCULOSKELETAL:  No joint effusions or joint deformity.  NEUROLOGIC:  Was described above.  PSYCHIATRIC:  Unable to evaluate due to aphasia and dysarthria.   LABORATORY DATA:  Her glucose is 114, her BUN is 36, her creatinine is  1.12.  Her sodium is 142, potassium 3.5.  Her GFR is around 50.  Her AST is 91 and ALT is 30.  Her albumin is 3.6.  Her total protein is 7.  Her troponin is 6.9.  Her total CK is 868.  Her white count is 9.9, her hemoglobin is 10, her platelet count is 217.  Urinalysis unremarkable.  CT scan of the head shows advanced atrophy with microvascular ischemic disease without acute intracranial process.  There is calcifications on bilateral basal ganglia with lacunar infarcts which are old.   ASSESSMENT AND PLAN:  A 74 year old female with history of being found down, was here in December with transient ischemic attack and malignant hypertension.  She was diagnosed with systolic congestive heart failure with cardiomyopathy, compensated.  She is not taking all the medications that she should be taking for that.  1.  Acute stroke.  The patient was admitted for being found down and during the evaluation the Emergency Room physician noted some ST depression at the level of lateral leads of less than 2 mm with a troponin of 6 for what he started treatment for non-ST-elevation myocardial infarction with a beta blocker and heparin.  The beta blocker was given.  It dropped blood pressure from 205 to 176, then 190 without any significant drop in her pulse below 80.  The patient started developing some symptoms of cerebrovascular accident which are significant.  She has a NIHSS scale of about 16.  Discussed the case with Dr. Loretha Brasil, neurology over here.  Unable to do t-PA due to the patient's heparin, but she is within a good period of time to do intervention.  Because of this, we are going to transfer her to [facility].  I spoke also with Dr. Leroy Kennedy who accepted the patient.  They wanted to do some intra-arterial starting with CT angiogram to see if we can localize the clot to do intervention.  If there is any intervention to do they will do intravascular t-PA.  The patient is a good candidate for transfer.  We had the transfer set  up.   2.  As far as her non-ST-elevation myocardial infarction, we will continue aspirin.  Continue beta blocker as possible without dropping her blood pressure.  Her treatment for blood pressure would be as needed if her systolics above 210 or diastolics above 110.  If her heart rate speeds up above 100 we are going to give low doses of beta blocker.  If her blood pressure remains above 210, consider starting on a drip of esmolol or labetalol.  The patient is not able to swallow.  She is going to be kept nothing by mouth, start a statin as soon as possible.  3.  Other medical problems seems to be stable.  She has peripheral vascular disease due to smoking.  She is not smoking anymore, but she is not taking statins.  She has a 50% to 60% stenosis of the right carotid artery.  This could be the source of the embolism, cannot rule out the possibility of atrial fibrillation that is paroxysmal.  4.  As far as the congestive heart failure, seems to be stable.  She has ejection fraction of around 40% to 45%.  She needs to be back on the ACE inhibitor whenever her blood pressure allows it.    The patient is to be transferred to [facility].  I have spent about 80 minutes with this patient, helping facilitating the transfer and since this is a critical situation, critical care time of 80 minutes.     ____________________________ Felipa Furnace, MD rsg:ea D: 10/11/2013 17:22:28 ET T: 10/11/2013 17:55:55 ET JOB#: [id number]  cc: Felipa Furnace, MD, <Dictator> Deazia Lampi Juanda Chance MD ELECTRONICALLY SIGNED 10/14/2013 13:06

## 2014-12-05 NOTE — H&P (Signed)
PATIENT NAME:  [name], Wendy MR#:  [id number] DATE OF BIRTH:  [birth year]  DATE OF ADMISSION:  10/11/2013  ADDENDUM  The patient developed atrial fibrillation with RVR when she was here in the hospital, leaving the ER with blood pressures dropping from the 200s down to systolics of 105. The patient given a bolus and her blood pressure increased in the 130s. The patient is not going to be cardioverted as she is keeping up with her blood pressure, but we are going to start a bolus of amiodarone with amiodarone drip. Decided to do this instead of Cardizem due to the potential in dropping her blood pressure much faster. We need to maintain intracerebral pressure and brain perfusion in order to prevent the stroke from getting any bigger.  The patient still plans for being transferred.    ____________________________ Felipa Furnaceoberto Sanchez Gutierrez, MD rsg:ce D: 10/11/2013 18:11:05 ET T: 10/11/2013 18:22:51 ET JOB#: [id number]  cc: Felipa Furnaceoberto Sanchez Gutierrez, MD, <Dictator> Kaulin Chaves Juanda ChanceSANCHEZ GUTIERRE MD ELECTRONICALLY SIGNED 10/14/2013 12:26

## 2014-12-05 NOTE — Discharge Summary (Signed)
PATIENT NAME:  Wendy Malone, [facility] MR#:  [id number] DATE OF BIRTH:  [birth year]  DATE OF ADMISSION:  10/27/2013 DATE OF DISCHARGE:  10/28/2013  For detailed note, please take a look at the history and physical done on admission by Dr. Luberta MutterKonidena.   DIAGNOSES AT DISCHARGE: Are as follows:  Acute severe hypernatremia, history of recent cerebrovascular accident with right-sided hemiparesis, adult failure to thrive, acute renal failure.   The patient is being discharged to hospice home.   CONSULTANTS DURING THE HOSPITAL COURSE: Dr. Harriett SineNancy Phifer from palliative care.   The patient is being discharged on a diet as tolerated.   ACTIVITY: As tolerated.   DISCHARGE MEDICATIONS: Morphine 0.25 mL q.2 hours as needed, lorazepam 0.5 mg injectable every hour as needed, glycopyrrolate 0.2 mg injectable every 4 hours as needed.   BRIEF HOSPITAL COURSE: This is a 74 year old female with medical problems as mentioned above, presented to the hospital on 10/27/2013, due to altered mental status and lethargy and noted to be severely hypernatremic.   The patient's overall health has been declining significantly since she was diagnosed with an acute stroke about a month or so ago. She was apparently transferred to [facility] after her acute stroke and then eventually discharged to [facility]. Over there, she has had significant failure to thrive and poor p.o. intake and was noted to be severely hypernatremic and, therefore, sent over to the hospital. A palliative care consult was obtained to discuss goals of care with the family and they opted to make the patient comfort care. The patient was, therefore, admitted under comfort care only while in the hospital. Palliative care and hospice discussed with the patient's family. The day after admission, they opted for discharge to hospice home; therefore, the patient is currently being discharged there. The patient is a DO NOT INTUBATE/DO NOT RESUSCITATE.     TIME  SPENT: 35 minutes.  ____________________________ Rolly PancakeVivek J. Cherlynn KaiserSainani, MD vjs:dmm D: 10/28/2013 15:46:11 ET T: 10/28/2013 20:13:57 ET JOB#: [id number]  cc: Rolly PancakeVivek J. Cherlynn KaiserSainani, MD, <Dictator> Houston SirenVIVEK J Kiylee Thoreson MD ELECTRONICALLY SIGNED 11/07/2013 20:23

## 2014-12-05 NOTE — H&P (Signed)
PATIENT NAME:  Wendy Malone, Wendy Malone MR#:  [id number] DATE OF BIRTH:  [birth year]  DATE OF ADMISSION:  10/27/2013  PRIMARY CARE PHYSICIAN: From [facility].   EMERGENCY ROOM PHYSICIAN:  Dr. Carollee MassedKaminski.  CHIEF COMPLAINT: Sent from [facility] because of hypernatremia.   HISTORY OF PRESENT ILLNESS: A 74 year old female patient who was discharged from this hospital of February 20th to [facility] for acute CVA. The patient went to [facility]. From there, she was discharged to [facility]. The patient was at [facility] since last Tuesday, that is on the 9th. The patient, apparently, is not eating well and she did not have a PEG tube, and the patient is sent in here because of a sodium of 170. Seen by Dr. Harvie JuniorPhifer in the Emergency Room, and the patient's family decided to do comfort care and possibly transfer to hospice home tomorrow. The patient right now is going to be admitted comfort care. The patient's daughter is at the bedside and she agreed with everything.   With regarding to old records, past medical history includes history of recent CVA in February and on February 20th she was transferred to [facility] with acute CVA. She has a history of atrial fibrillation, hypertension, mild dementia, carotid artery stenosis, cardiomyopathy of 40%  to 45%, chronic systolic heart failure, tobacco abuse, chronic kidney disease.   ALLERGIES: The patient has no allergies.  SOCIAL HISTORY: Previous smoker. No alcohol. No drugs. Right now, she is in [facility].   MEDICATIONS: Right now n.p.o., not on any medicines.   REVIEW OF SYSTEMS: Not obtainable secondary to her stroke and aphasia.    PHYSICAL EXAMINATION: VITAL SIGNS: Heart rate is about 98, temperature 98.9, blood pressure 183/91, sats 92% on room air.  GENERAL: The patient is awake and has expressive aphasia.  HEAD: Normocephalic, atraumatic. The patient has dense right-sided neglect.  ENT: No tympanic membrane  congestion. No turbinate hypertrophy. No oropharyngeal erythema.  NECK:  No lymphadenopathy. No thyroid enlargement.  CARDIOVASCULAR: S1, S2 regular. No murmurs.  GASTROINTESTINAL: Nontender, nondistended. No hernias. Bowel sounds present.  EXTREMITIES:  The patient has right side deficit. Left side power is intact and the tone is intact.  LUNGS: Clear to auscultation. No wheeze. No rales. Not using accessory muscles of respiration.    LABORATORY DATA: WBC 8.3, hemoglobin 10.4, hematocrit 33.5, platelets 142. ELECTROLYTES: Sodium is about 160, BUN 63, creatinine 1.52, potassium 4.2, bicarb 23. WBC 8.3, hemoglobin 10.4, hematocrit 33.5, platelets 142. EKG showed normal sinus rhythm, 98 beats per minute.   ASSESSMENT AND PLAN: A 74 year old female with recent stroke, now bedridden, not eating well, not eating or drinking because of dysphasia. Did not have a PEG tube. Comes in with hypernatremia with dehydration. Family decided about comfort care, and Dr. Harvie JuniorPhifer has seen the patient. Comfort care orders are written, and I have confirmed with the patient's daughter, Wendy Malone, in the room. She has agreed with that, so will admit to 1C, and we will continue Ativan and morphine for comfort care. The patient probably will be transferred to hospice home tomorrow.   CODE STATUS: DO NOT RESUSCITATE.   TIME SPENT: About 45 minutes.       ____________________________ Wendy HammingSnehalatha Chenel Wernli, MD sk:dmm D: 10/27/2013 18:17:23 ET T: 10/27/2013 19:50:02 ET JOB#: [id number]  cc: Wendy HammingSnehalatha Jeselle Hiser, MD, <Dictator> Wendy HammingSNEHALATHA Treyvone Chelf MD ELECTRONICALLY SIGNED 2014-07-11 13:46
